# Patient Record
Sex: Female | Born: 1945 | Race: White | Hispanic: No | Marital: Married | Smoking: Former smoker
Health system: Southern US, Community
[De-identification: ages and names within clinical notes are randomized; demographics above are authoritative.]

## PROBLEM LIST (undated history)

## (undated) DIAGNOSIS — M1990 Unspecified osteoarthritis, unspecified site: Secondary | ICD-10-CM

## (undated) DIAGNOSIS — R112 Nausea with vomiting, unspecified: Secondary | ICD-10-CM

## (undated) DIAGNOSIS — D509 Iron deficiency anemia, unspecified: Secondary | ICD-10-CM

## (undated) DIAGNOSIS — M797 Fibromyalgia: Secondary | ICD-10-CM

## (undated) DIAGNOSIS — Z9889 Other specified postprocedural states: Secondary | ICD-10-CM

## (undated) DIAGNOSIS — K3184 Gastroparesis: Secondary | ICD-10-CM

## (undated) DIAGNOSIS — F32A Depression, unspecified: Secondary | ICD-10-CM

## (undated) DIAGNOSIS — I10 Essential (primary) hypertension: Secondary | ICD-10-CM

## (undated) DIAGNOSIS — N3010 Interstitial cystitis (chronic) without hematuria: Secondary | ICD-10-CM

## (undated) DIAGNOSIS — K589 Irritable bowel syndrome without diarrhea: Secondary | ICD-10-CM

## (undated) DIAGNOSIS — F329 Major depressive disorder, single episode, unspecified: Secondary | ICD-10-CM

## (undated) DIAGNOSIS — J449 Chronic obstructive pulmonary disease, unspecified: Secondary | ICD-10-CM

## (undated) DIAGNOSIS — T884XXA Failed or difficult intubation, initial encounter: Secondary | ICD-10-CM

## (undated) HISTORY — DX: Unspecified osteoarthritis, unspecified site: M19.90

## (undated) HISTORY — DX: Fibromyalgia: M79.7

## (undated) HISTORY — DX: Interstitial cystitis (chronic) without hematuria: N30.10

## (undated) HISTORY — DX: Iron deficiency anemia, unspecified: D50.9

## (undated) HISTORY — PX: THYROIDECTOMY, PARTIAL: SHX18

## (undated) HISTORY — DX: Irritable bowel syndrome without diarrhea: K58.9

## (undated) HISTORY — DX: Gastroparesis: K31.84

## (undated) HISTORY — DX: Essential (primary) hypertension: I10

## (undated) HISTORY — PX: ANTERIOR CERVICAL DECOMP/DISCECTOMY FUSION: SHX1161

## (undated) HISTORY — PX: LUMBAR FUSION: SHX111

---
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
## 1972-07-12 HISTORY — PX: APPENDECTOMY: SHX54

## 1995-07-13 HISTORY — PX: VESICO-VAGINAL FISTULA REPAIR: SHX5129

## 1996-07-12 HISTORY — PX: VESICOVAGINAL FISTULA CLOSURE W/ TAH: SUR271

## 1997-11-08 ENCOUNTER — Encounter: Admission: RE | Admit: 1997-11-08 | Discharge: 1998-02-06 | Payer: Self-pay | Admitting: Gastroenterology

## 1997-12-24 ENCOUNTER — Ambulatory Visit (HOSPITAL_BASED_OUTPATIENT_CLINIC_OR_DEPARTMENT_OTHER): Admission: RE | Admit: 1997-12-24 | Discharge: 1997-12-24 | Payer: Self-pay | Admitting: Plastic Surgery

## 1998-02-28 ENCOUNTER — Encounter: Admission: RE | Admit: 1998-02-28 | Discharge: 1998-05-29 | Payer: Self-pay | Admitting: Gastroenterology

## 1998-06-10 ENCOUNTER — Encounter: Admission: RE | Admit: 1998-06-10 | Discharge: 1998-09-08 | Payer: Self-pay | Admitting: Gastroenterology

## 1998-06-24 ENCOUNTER — Other Ambulatory Visit: Admission: RE | Admit: 1998-06-24 | Discharge: 1998-06-24 | Payer: Self-pay | Admitting: Obstetrics and Gynecology

## 1998-11-20 ENCOUNTER — Encounter: Admission: RE | Admit: 1998-11-20 | Discharge: 1999-02-18 | Payer: Self-pay | Admitting: Gastroenterology

## 1999-01-12 ENCOUNTER — Ambulatory Visit (HOSPITAL_COMMUNITY): Admission: RE | Admit: 1999-01-12 | Discharge: 1999-01-12 | Payer: Self-pay | Admitting: Obstetrics and Gynecology

## 1999-01-12 ENCOUNTER — Encounter: Payer: Self-pay | Admitting: Obstetrics and Gynecology

## 1999-01-20 ENCOUNTER — Ambulatory Visit (HOSPITAL_COMMUNITY): Admission: RE | Admit: 1999-01-20 | Discharge: 1999-01-20 | Payer: Self-pay | Admitting: Gastroenterology

## 1999-01-20 ENCOUNTER — Encounter (INDEPENDENT_AMBULATORY_CARE_PROVIDER_SITE_OTHER): Payer: Self-pay | Admitting: Specialist

## 1999-02-26 ENCOUNTER — Encounter: Payer: Self-pay | Admitting: Neurological Surgery

## 1999-02-26 ENCOUNTER — Inpatient Hospital Stay (HOSPITAL_COMMUNITY): Admission: RE | Admit: 1999-02-26 | Discharge: 1999-02-27 | Payer: Self-pay | Admitting: Neurological Surgery

## 1999-04-29 ENCOUNTER — Encounter: Admission: RE | Admit: 1999-04-29 | Discharge: 1999-04-29 | Payer: Self-pay | Admitting: Neurological Surgery

## 1999-04-29 ENCOUNTER — Encounter: Payer: Self-pay | Admitting: Neurological Surgery

## 1999-04-30 ENCOUNTER — Encounter: Admission: RE | Admit: 1999-04-30 | Discharge: 1999-06-19 | Payer: Self-pay | Admitting: Neurological Surgery

## 1999-05-05 ENCOUNTER — Encounter: Payer: Self-pay | Admitting: Neurological Surgery

## 1999-05-05 ENCOUNTER — Ambulatory Visit (HOSPITAL_COMMUNITY): Admission: RE | Admit: 1999-05-05 | Discharge: 1999-05-05 | Payer: Self-pay | Admitting: Neurological Surgery

## 1999-07-16 ENCOUNTER — Other Ambulatory Visit: Admission: RE | Admit: 1999-07-16 | Discharge: 1999-07-16 | Payer: Self-pay | Admitting: Obstetrics and Gynecology

## 1999-11-05 ENCOUNTER — Encounter: Payer: Self-pay | Admitting: Obstetrics and Gynecology

## 1999-11-05 ENCOUNTER — Ambulatory Visit (HOSPITAL_COMMUNITY): Admission: RE | Admit: 1999-11-05 | Discharge: 1999-11-05 | Payer: Self-pay | Admitting: Obstetrics and Gynecology

## 2000-03-10 ENCOUNTER — Ambulatory Visit (HOSPITAL_COMMUNITY): Admission: RE | Admit: 2000-03-10 | Discharge: 2000-03-10 | Payer: Self-pay | Admitting: Pulmonary Disease

## 2000-08-04 ENCOUNTER — Other Ambulatory Visit: Admission: RE | Admit: 2000-08-04 | Discharge: 2000-08-04 | Payer: Self-pay | Admitting: Obstetrics and Gynecology

## 2000-12-07 ENCOUNTER — Ambulatory Visit (HOSPITAL_COMMUNITY): Admission: RE | Admit: 2000-12-07 | Discharge: 2000-12-07 | Payer: Self-pay | Admitting: Pulmonary Disease

## 2000-12-17 ENCOUNTER — Encounter: Admission: RE | Admit: 2000-12-17 | Discharge: 2000-12-17 | Payer: Self-pay | Admitting: Rheumatology

## 2000-12-17 ENCOUNTER — Encounter: Payer: Self-pay | Admitting: Rheumatology

## 2000-12-26 ENCOUNTER — Encounter (HOSPITAL_COMMUNITY): Admission: RE | Admit: 2000-12-26 | Discharge: 2001-01-25 | Payer: Self-pay | Admitting: Sports Medicine

## 2001-07-25 ENCOUNTER — Ambulatory Visit (HOSPITAL_COMMUNITY): Admission: RE | Admit: 2001-07-25 | Discharge: 2001-07-25 | Payer: Self-pay | Admitting: Sports Medicine

## 2001-07-25 ENCOUNTER — Encounter: Payer: Self-pay | Admitting: Sports Medicine

## 2001-08-31 ENCOUNTER — Other Ambulatory Visit: Admission: RE | Admit: 2001-08-31 | Discharge: 2001-08-31 | Payer: Self-pay | Admitting: Obstetrics and Gynecology

## 2001-09-05 ENCOUNTER — Encounter: Admission: RE | Admit: 2001-09-05 | Discharge: 2001-09-05 | Payer: Self-pay | Admitting: Obstetrics and Gynecology

## 2001-09-05 ENCOUNTER — Encounter: Payer: Self-pay | Admitting: Obstetrics and Gynecology

## 2001-09-25 ENCOUNTER — Encounter (INDEPENDENT_AMBULATORY_CARE_PROVIDER_SITE_OTHER): Payer: Self-pay | Admitting: Specialist

## 2001-09-25 ENCOUNTER — Inpatient Hospital Stay (HOSPITAL_COMMUNITY): Admission: RE | Admit: 2001-09-25 | Discharge: 2001-09-28 | Payer: Self-pay | Admitting: Obstetrics and Gynecology

## 2002-02-23 ENCOUNTER — Emergency Department (HOSPITAL_COMMUNITY): Admission: EM | Admit: 2002-02-23 | Discharge: 2002-02-23 | Payer: Self-pay | Admitting: Emergency Medicine

## 2002-02-23 ENCOUNTER — Encounter: Payer: Self-pay | Admitting: Emergency Medicine

## 2002-03-05 ENCOUNTER — Ambulatory Visit (HOSPITAL_COMMUNITY): Admission: RE | Admit: 2002-03-05 | Discharge: 2002-03-05 | Payer: Self-pay | Admitting: *Deleted

## 2002-07-20 ENCOUNTER — Encounter: Payer: Self-pay | Admitting: Family Medicine

## 2002-07-20 ENCOUNTER — Ambulatory Visit (HOSPITAL_COMMUNITY): Admission: RE | Admit: 2002-07-20 | Discharge: 2002-07-20 | Payer: Self-pay | Admitting: Family Medicine

## 2002-08-15 ENCOUNTER — Encounter: Payer: Self-pay | Admitting: Family Medicine

## 2002-08-15 ENCOUNTER — Ambulatory Visit (HOSPITAL_COMMUNITY): Admission: RE | Admit: 2002-08-15 | Discharge: 2002-08-15 | Payer: Self-pay | Admitting: Family Medicine

## 2002-09-17 ENCOUNTER — Other Ambulatory Visit: Admission: RE | Admit: 2002-09-17 | Discharge: 2002-09-17 | Payer: Self-pay | Admitting: Obstetrics and Gynecology

## 2002-10-09 ENCOUNTER — Encounter (INDEPENDENT_AMBULATORY_CARE_PROVIDER_SITE_OTHER): Payer: Self-pay | Admitting: Specialist

## 2002-10-09 ENCOUNTER — Ambulatory Visit (HOSPITAL_COMMUNITY): Admission: RE | Admit: 2002-10-09 | Discharge: 2002-10-09 | Payer: Self-pay | Admitting: Obstetrics and Gynecology

## 2002-10-26 ENCOUNTER — Encounter: Payer: Self-pay | Admitting: Orthopedic Surgery

## 2002-10-26 ENCOUNTER — Ambulatory Visit (HOSPITAL_COMMUNITY): Admission: RE | Admit: 2002-10-26 | Discharge: 2002-10-26 | Payer: Self-pay | Admitting: Orthopedic Surgery

## 2002-12-03 ENCOUNTER — Ambulatory Visit (HOSPITAL_COMMUNITY): Admission: RE | Admit: 2002-12-03 | Discharge: 2002-12-03 | Payer: Self-pay | Admitting: Pulmonary Disease

## 2002-12-11 ENCOUNTER — Ambulatory Visit (HOSPITAL_COMMUNITY): Admission: RE | Admit: 2002-12-11 | Discharge: 2002-12-11 | Payer: Self-pay | Admitting: Pulmonary Disease

## 2003-12-03 ENCOUNTER — Encounter (INDEPENDENT_AMBULATORY_CARE_PROVIDER_SITE_OTHER): Payer: Self-pay | Admitting: Specialist

## 2003-12-03 ENCOUNTER — Ambulatory Visit (HOSPITAL_COMMUNITY): Admission: RE | Admit: 2003-12-03 | Discharge: 2003-12-03 | Payer: Self-pay | Admitting: Gastroenterology

## 2004-11-11 ENCOUNTER — Other Ambulatory Visit: Admission: RE | Admit: 2004-11-11 | Discharge: 2004-11-11 | Payer: Self-pay | Admitting: Obstetrics and Gynecology

## 2005-04-16 ENCOUNTER — Encounter: Admission: RE | Admit: 2005-04-16 | Discharge: 2005-04-16 | Payer: Self-pay | Admitting: Gastroenterology

## 2005-05-22 ENCOUNTER — Emergency Department (HOSPITAL_COMMUNITY): Admission: EM | Admit: 2005-05-22 | Discharge: 2005-05-22 | Payer: Self-pay | Admitting: Emergency Medicine

## 2007-08-14 ENCOUNTER — Ambulatory Visit: Payer: Self-pay | Admitting: Physical Medicine & Rehabilitation

## 2007-08-14 ENCOUNTER — Encounter
Admission: RE | Admit: 2007-08-14 | Discharge: 2007-08-14 | Payer: Self-pay | Admitting: Physical Medicine & Rehabilitation

## 2008-02-16 ENCOUNTER — Ambulatory Visit (HOSPITAL_COMMUNITY): Admission: RE | Admit: 2008-02-16 | Discharge: 2008-02-16 | Payer: Self-pay | Admitting: Pulmonary Disease

## 2008-05-07 ENCOUNTER — Ambulatory Visit (HOSPITAL_BASED_OUTPATIENT_CLINIC_OR_DEPARTMENT_OTHER): Admission: RE | Admit: 2008-05-07 | Discharge: 2008-05-07 | Payer: Self-pay | Admitting: Pulmonary Disease

## 2008-05-11 ENCOUNTER — Ambulatory Visit: Payer: Self-pay | Admitting: Internal Medicine

## 2008-07-02 ENCOUNTER — Ambulatory Visit (HOSPITAL_COMMUNITY): Admission: RE | Admit: 2008-07-02 | Discharge: 2008-07-02 | Payer: Self-pay | Admitting: Pulmonary Disease

## 2008-07-12 HISTORY — PX: SPINAL FUSION: SHX223

## 2008-12-31 ENCOUNTER — Encounter (HOSPITAL_COMMUNITY): Admission: RE | Admit: 2008-12-31 | Discharge: 2008-12-31 | Payer: Self-pay | Admitting: Neurosurgery

## 2009-01-22 ENCOUNTER — Ambulatory Visit (HOSPITAL_COMMUNITY): Admission: RE | Admit: 2009-01-22 | Discharge: 2009-01-22 | Payer: Self-pay | Admitting: Pulmonary Disease

## 2009-03-06 ENCOUNTER — Ambulatory Visit (HOSPITAL_COMMUNITY): Admission: AD | Admit: 2009-03-06 | Discharge: 2009-03-07 | Payer: Self-pay | Admitting: Gastroenterology

## 2009-08-20 ENCOUNTER — Ambulatory Visit (HOSPITAL_COMMUNITY): Admission: RE | Admit: 2009-08-20 | Discharge: 2009-08-20 | Payer: Self-pay | Admitting: Pulmonary Disease

## 2009-10-20 ENCOUNTER — Ambulatory Visit (HOSPITAL_COMMUNITY): Admission: RE | Admit: 2009-10-20 | Discharge: 2009-10-20 | Payer: Self-pay | Admitting: Pulmonary Disease

## 2009-11-10 ENCOUNTER — Ambulatory Visit (HOSPITAL_COMMUNITY): Admission: RE | Admit: 2009-11-10 | Discharge: 2009-11-10 | Payer: Self-pay | Admitting: Neurosurgery

## 2010-04-15 ENCOUNTER — Ambulatory Visit (HOSPITAL_COMMUNITY): Admission: RE | Admit: 2010-04-15 | Discharge: 2010-04-15 | Payer: Self-pay | Admitting: Neurosurgery

## 2010-04-30 ENCOUNTER — Ambulatory Visit (HOSPITAL_COMMUNITY): Admission: RE | Admit: 2010-04-30 | Discharge: 2010-04-30 | Payer: Self-pay | Admitting: Pulmonary Disease

## 2010-06-29 ENCOUNTER — Ambulatory Visit (HOSPITAL_COMMUNITY): Payer: Self-pay | Admitting: Oncology

## 2010-07-14 ENCOUNTER — Encounter (HOSPITAL_COMMUNITY)
Admission: RE | Admit: 2010-07-14 | Discharge: 2010-08-11 | Payer: Self-pay | Source: Home / Self Care | Attending: Oncology | Admitting: Oncology

## 2010-07-20 ENCOUNTER — Ambulatory Visit (HOSPITAL_COMMUNITY)
Admission: RE | Admit: 2010-07-20 | Discharge: 2010-07-20 | Payer: Self-pay | Source: Home / Self Care | Attending: Oncology | Admitting: Oncology

## 2010-07-27 LAB — RETICULOCYTES
RBC.: 3.94 MIL/uL (ref 3.87–5.11)
Retic Count, Absolute: 35.5 10*3/uL (ref 19.0–186.0)
Retic Ct Pct: 0.9 % (ref 0.4–3.1)

## 2010-07-27 LAB — IMMUNOFIXATION ELECTROPHORESIS
IgA: 250 mg/dL (ref 68–378)
IgG (Immunoglobin G), Serum: 432 mg/dL — ABNORMAL LOW (ref 694–1618)
IgM, Serum: 38 mg/dL — ABNORMAL LOW (ref 60–263)
Total Protein ELP: 6.2 g/dL (ref 6.0–8.3)

## 2010-07-27 LAB — CBC
HCT: 33.6 % — ABNORMAL LOW (ref 36.0–46.0)
Hemoglobin: 11.2 g/dL — ABNORMAL LOW (ref 12.0–15.0)
MCH: 28.4 pg (ref 26.0–34.0)
MCHC: 33.3 g/dL (ref 30.0–36.0)
MCV: 85.3 fL (ref 78.0–100.0)
Platelets: 334 10*3/uL (ref 150–400)
RBC: 3.94 MIL/uL (ref 3.87–5.11)
RDW: 19.3 % — ABNORMAL HIGH (ref 11.5–15.5)
WBC: 8.1 10*3/uL (ref 4.0–10.5)

## 2010-07-27 LAB — PROTEIN ELECTROPH W RFLX QUANT IMMUNOGLOBULINS
Albumin ELP: 59.3 % (ref 55.8–66.1)
Alpha-1-Globulin: 6.3 % — ABNORMAL HIGH (ref 2.9–4.9)
Alpha-2-Globulin: 14.5 % — ABNORMAL HIGH (ref 7.1–11.8)
Beta 2: 5.3 % (ref 3.2–6.5)
Beta Globulin: 7 % (ref 4.7–7.2)
Gamma Globulin: 7.6 % — ABNORMAL LOW (ref 11.1–18.8)
M-Spike, %: NOT DETECTED g/dL
Total Protein ELP: 6.1 g/dL (ref 6.0–8.3)

## 2010-07-27 LAB — IRON AND TIBC
Iron: 23 ug/dL — ABNORMAL LOW (ref 42–135)
Saturation Ratios: 8 % — ABNORMAL LOW (ref 20–55)
TIBC: 304 ug/dL (ref 250–470)
UIBC: 281 ug/dL

## 2010-07-27 LAB — IGG, IGA, IGM
IgA: 255 mg/dL (ref 68–378)
IgG (Immunoglobin G), Serum: 468 mg/dL — ABNORMAL LOW (ref 694–1618)
IgM, Serum: 40 mg/dL — ABNORMAL LOW (ref 60–263)

## 2010-07-27 LAB — IMMUNOFIXATION ADD-ON

## 2010-07-27 LAB — FERRITIN: Ferritin: 67 ng/mL (ref 10–291)

## 2010-07-27 LAB — SEDIMENTATION RATE: Sed Rate: 16 mm/hr (ref 0–22)

## 2010-08-01 ENCOUNTER — Encounter: Payer: Self-pay | Admitting: Pulmonary Disease

## 2010-08-01 ENCOUNTER — Encounter: Payer: Self-pay | Admitting: Gastroenterology

## 2010-08-02 ENCOUNTER — Encounter: Payer: Self-pay | Admitting: Pulmonary Disease

## 2010-08-07 ENCOUNTER — Encounter
Admission: RE | Admit: 2010-08-07 | Discharge: 2010-08-07 | Payer: Self-pay | Source: Home / Self Care | Attending: Otolaryngology | Admitting: Otolaryngology

## 2010-08-21 ENCOUNTER — Encounter (HOSPITAL_COMMUNITY): Payer: BC Managed Care – PPO | Attending: Oncology

## 2010-08-21 ENCOUNTER — Other Ambulatory Visit (HOSPITAL_COMMUNITY): Payer: BC Managed Care – PPO

## 2010-08-21 ENCOUNTER — Other Ambulatory Visit (HOSPITAL_COMMUNITY): Payer: Self-pay | Admitting: Oncology

## 2010-08-21 DIAGNOSIS — D649 Anemia, unspecified: Secondary | ICD-10-CM

## 2010-08-21 DIAGNOSIS — D509 Iron deficiency anemia, unspecified: Secondary | ICD-10-CM | POA: Insufficient documentation

## 2010-08-21 LAB — CBC
MCH: 30.3 pg (ref 26.0–34.0)
MCHC: 34 g/dL (ref 30.0–36.0)
MCV: 89 fL (ref 78.0–100.0)
Platelets: 244 10*3/uL (ref 150–400)
RBC: 4.36 MIL/uL (ref 3.87–5.11)
RDW: 17.5 % — ABNORMAL HIGH (ref 11.5–15.5)

## 2010-08-22 LAB — FERRITIN: Ferritin: 734 ng/mL — ABNORMAL HIGH (ref 10–291)

## 2010-09-15 ENCOUNTER — Other Ambulatory Visit (HOSPITAL_COMMUNITY): Payer: Self-pay | Admitting: Neurosurgery

## 2010-09-15 DIAGNOSIS — M542 Cervicalgia: Secondary | ICD-10-CM

## 2010-09-18 ENCOUNTER — Other Ambulatory Visit (HOSPITAL_COMMUNITY): Payer: Self-pay | Admitting: Neurosurgery

## 2010-09-18 ENCOUNTER — Ambulatory Visit (HOSPITAL_COMMUNITY)
Admission: RE | Admit: 2010-09-18 | Discharge: 2010-09-18 | Disposition: A | Discharge status: Home / Self Care | Payer: BC Managed Care – PPO | Source: Ambulatory Visit | Attending: Neurosurgery | Admitting: Neurosurgery

## 2010-09-18 DIAGNOSIS — M899 Disorder of bone, unspecified: Secondary | ICD-10-CM | POA: Insufficient documentation

## 2010-09-18 DIAGNOSIS — M542 Cervicalgia: Secondary | ICD-10-CM

## 2010-09-18 DIAGNOSIS — M546 Pain in thoracic spine: Secondary | ICD-10-CM | POA: Insufficient documentation

## 2010-09-18 DIAGNOSIS — M4120 Other idiopathic scoliosis, site unspecified: Secondary | ICD-10-CM | POA: Insufficient documentation

## 2010-09-18 DIAGNOSIS — IMO0002 Reserved for concepts with insufficient information to code with codable children: Secondary | ICD-10-CM | POA: Insufficient documentation

## 2010-10-13 ENCOUNTER — Other Ambulatory Visit (HOSPITAL_COMMUNITY): Payer: Self-pay | Admitting: Neurosurgery

## 2010-10-13 DIAGNOSIS — M47812 Spondylosis without myelopathy or radiculopathy, cervical region: Secondary | ICD-10-CM

## 2010-10-15 ENCOUNTER — Encounter (HOSPITAL_COMMUNITY)
Admission: RE | Admit: 2010-10-15 | Discharge: 2010-10-15 | Disposition: A | Discharge status: Home / Self Care | Payer: BC Managed Care – PPO | Source: Ambulatory Visit | Attending: Neurosurgery | Admitting: Neurosurgery

## 2010-10-15 ENCOUNTER — Encounter (HOSPITAL_COMMUNITY): Payer: Self-pay

## 2010-10-15 ENCOUNTER — Encounter (HOSPITAL_COMMUNITY): Payer: BC Managed Care – PPO

## 2010-10-15 DIAGNOSIS — Z981 Arthrodesis status: Secondary | ICD-10-CM | POA: Insufficient documentation

## 2010-10-15 DIAGNOSIS — M47812 Spondylosis without myelopathy or radiculopathy, cervical region: Secondary | ICD-10-CM

## 2010-10-15 DIAGNOSIS — Z85828 Personal history of other malignant neoplasm of skin: Secondary | ICD-10-CM | POA: Insufficient documentation

## 2010-10-15 DIAGNOSIS — M545 Low back pain, unspecified: Secondary | ICD-10-CM | POA: Insufficient documentation

## 2010-10-15 DIAGNOSIS — M542 Cervicalgia: Secondary | ICD-10-CM | POA: Insufficient documentation

## 2010-10-15 MED ORDER — TECHNETIUM TC 99M MEDRONATE IV KIT
25.0000 | PACK | Freq: Once | INTRAVENOUS | Status: AC | PRN
  Administered 2010-10-15: 25 via INTRAVENOUS

## 2010-10-16 ENCOUNTER — Ambulatory Visit (HOSPITAL_COMMUNITY): Payer: BC Managed Care – PPO

## 2010-10-16 ENCOUNTER — Encounter (HOSPITAL_COMMUNITY): Payer: BC Managed Care – PPO

## 2010-10-17 LAB — CBC
HCT: 30.3 % — ABNORMAL LOW (ref 36.0–46.0)
Hemoglobin: 9.3 g/dL — ABNORMAL LOW (ref 12.0–15.0)
Hemoglobin: 9.8 g/dL — ABNORMAL LOW (ref 12.0–15.0)
MCHC: 32.3 g/dL (ref 30.0–36.0)
MCHC: 33.2 g/dL (ref 30.0–36.0)
MCV: 80.4 fL (ref 78.0–100.0)
RBC: 3.49 MIL/uL — ABNORMAL LOW (ref 3.87–5.11)
RBC: 3.79 MIL/uL — ABNORMAL LOW (ref 3.87–5.11)
RDW: 16.5 % — ABNORMAL HIGH (ref 11.5–15.5)
RDW: 16.6 % — ABNORMAL HIGH (ref 11.5–15.5)

## 2010-10-17 LAB — BASIC METABOLIC PANEL
BUN: 9 mg/dL (ref 6–23)
CO2: 26 mEq/L (ref 19–32)
CO2: 28 mEq/L (ref 19–32)
Calcium: 8.5 mg/dL (ref 8.4–10.5)
Chloride: 100 mEq/L (ref 96–112)
Chloride: 99 mEq/L (ref 96–112)
GFR calc Af Amer: >60 mL/min (ref >60)
GFR calc non Af Amer: >60 mL/min (ref >60)
Glucose, Bld: 127 mg/dL — ABNORMAL HIGH (ref 70–99)
Glucose, Bld: 135 mg/dL — ABNORMAL HIGH (ref 70–99)
Potassium: 2.4 mEq/L — CL (ref 3.5–5.1)
Potassium: 3.1 mEq/L — ABNORMAL LOW (ref 3.5–5.1)
Sodium: 133 mEq/L — ABNORMAL LOW (ref 135–145)
Sodium: 137 mEq/L (ref 135–145)

## 2010-10-17 LAB — BASIC METABOLIC PANEL WITH GFR
BUN: 4 mg/dL — ABNORMAL LOW (ref 6–23)
CO2: 24 meq/L (ref 19–32)
Calcium: 8.3 mg/dL — ABNORMAL LOW (ref 8.4–10.5)
Chloride: 106 meq/L (ref 96–112)
Creatinine, Ser: 0.8 mg/dL (ref 0.4–1.2)
GFR calc Af Amer: >60 mL/min (ref >60)
GFR calc non Af Amer: >60 mL/min (ref >60)
Glucose, Bld: 110 mg/dL — ABNORMAL HIGH (ref 70–99)
Potassium: 4.6 meq/L (ref 3.5–5.1)
Sodium: 134 meq/L — ABNORMAL LOW (ref 135–145)

## 2010-10-23 ENCOUNTER — Other Ambulatory Visit (HOSPITAL_COMMUNITY): Payer: BC Managed Care – PPO

## 2010-10-26 ENCOUNTER — Ambulatory Visit (HOSPITAL_COMMUNITY)
Admission: RE | Admit: 2010-10-26 | Discharge: 2010-10-26 | Disposition: A | Discharge status: Home / Self Care | Payer: BC Managed Care – PPO | Source: Ambulatory Visit | Attending: Pulmonary Disease | Admitting: Pulmonary Disease

## 2010-10-26 ENCOUNTER — Other Ambulatory Visit (HOSPITAL_COMMUNITY): Payer: Self-pay | Admitting: Pulmonary Disease

## 2010-10-26 DIAGNOSIS — Z87891 Personal history of nicotine dependence: Secondary | ICD-10-CM | POA: Insufficient documentation

## 2010-10-26 DIAGNOSIS — R05 Cough: Secondary | ICD-10-CM

## 2010-10-26 DIAGNOSIS — R059 Cough, unspecified: Secondary | ICD-10-CM

## 2010-10-28 ENCOUNTER — Other Ambulatory Visit (HOSPITAL_COMMUNITY): Payer: Self-pay | Admitting: Pulmonary Disease

## 2010-10-28 DIAGNOSIS — R609 Edema, unspecified: Secondary | ICD-10-CM

## 2010-10-29 ENCOUNTER — Ambulatory Visit (HOSPITAL_COMMUNITY): Payer: BC Managed Care – PPO

## 2010-10-30 ENCOUNTER — Ambulatory Visit (HOSPITAL_COMMUNITY): Payer: Self-pay | Admitting: Oncology

## 2010-10-30 ENCOUNTER — Ambulatory Visit (HOSPITAL_COMMUNITY)
Admission: RE | Admit: 2010-10-30 | Discharge: 2010-10-30 | Disposition: A | Discharge status: Home / Self Care | Payer: BC Managed Care – PPO | Source: Ambulatory Visit | Attending: Pulmonary Disease | Admitting: Pulmonary Disease

## 2010-10-30 DIAGNOSIS — R930 Abnormal findings on diagnostic imaging of skull and head, not elsewhere classified: Secondary | ICD-10-CM | POA: Insufficient documentation

## 2010-10-30 DIAGNOSIS — R609 Edema, unspecified: Secondary | ICD-10-CM

## 2010-10-30 MED ORDER — GADOBENATE DIMEGLUMINE 529 MG/ML IV SOLN: 15.0000 mL | Freq: Once | INTRAVENOUS | Status: AC | PRN

## 2010-11-03 ENCOUNTER — Ambulatory Visit (HOSPITAL_COMMUNITY): Payer: BC Managed Care – PPO

## 2010-11-04 ENCOUNTER — Encounter (HOSPITAL_COMMUNITY): Payer: BC Managed Care – PPO | Attending: Oncology | Admitting: Oncology

## 2010-11-04 DIAGNOSIS — D509 Iron deficiency anemia, unspecified: Secondary | ICD-10-CM

## 2010-11-24 NOTE — Op Note (Signed)
Kristina Horton, ENSEY NO.:  [id number]   MEDICAL RECORD NO.:  [id number]          PATIENT TYPE:  OIB   LOCATION:  1422                         FACILITY:  [facility]   PHYSICIAN:  Petra Kuba, M.D.    DATE OF BIRTH:  07-23-1945   DATE OF PROCEDURE:  03/07/2009  DATE OF DISCHARGE:                               OPERATIVE REPORT   PROCEDURE:  Esophagogastroduodenoscopy.   INDICATIONS:  Anemia and reflux.   Consent was signed after risks, benefits, methods, options thoroughly  discussed multiple times in the past.   MEDICINES USED:  Per general anesthesia.   PROCEDURE:  The video endoscope was inserted by direct vision.  The  esophagus was normal.  She did have a small hiatal hernia.  Scope passed  into the stomach, advanced through a normal antrum, normal pylorus into  a normal duodenal bulb and around the C-loop to a normal second portion  of the duodenum.  No blood was seen distally.  Scope was withdrawn back  to the bulb and a good look there ruled out abnormalities in that  location.  The scope was withdrawn back to the stomach and retroflexed.  Cardia, fundus, angularis, lesser and greater curve were evaluated on  retroflexed and then straight visualization without additional findings.  Air was suctioned, scope slowly withdrawn again, a good look at the  esophagus was normal, scope was removed.  The patient tolerated the  procedure well.  There was no obvious immediate complication.   ENDOSCOPIC DIAGNOSES:  1. Small hiatal hernia.  2. Otherwise normal esophagogastroduodenoscopy.   PLAN:  Happy to see back p.r.n., return care to her other doctors and  Dr. Juanetta Gosling, and please see colonoscopy for other recommendations,  workup and plans.           ______________________________  Petra Kuba, M.D.     MEM/MEDQ  D:  03/07/2009  T:  03/07/2009  Job:  [id number]   cc:   Ramon Dredge L. Juanetta Gosling, M.D.  Fax: [phone]

## 2010-11-24 NOTE — Group Therapy Note (Signed)
REFERRED BY:  Dr. Juanetta Gosling and Dr. Corliss Skains.   PURPOSE OF EVALUATION:  Evaluate and treat chronic  osteoarthritic/fibromyalgia pain.   HISTORY OF PRESENT ILLNESS:  Kristina Horton is a 65 year old right-handed  Caucasian female referred to this office by Dr. Juanetta Gosling and Dr.  Corliss Skains for evaluation and treatment of chronic arthritic and  fibromyalgia pain.   The patient reports that she started seeing Dr. Corliss Skains April of 2000.  I have some notes that review the office visit with Dr. Corliss Skains and  also the pain medicines prescribed by Dr. Juanetta Gosling.  We did review these  and then there were problems developed in terms of prescription refills  if we were to continue seeing her as a patient.  Those will be described  as below.   The patient reports that she has seen Dr. Corliss Skains since April 2000.  She was diagnosed with fibromyalgia with active disease and generalized  pain and positive tender points.  She also has a history of severe  osteoarthritis involving multiple joints.  She did see a Dr. Darene Lamer, an  orthopedist at [facility].  He told her that she had aggressive  osteoarthritis leading to the fibromyalgia, and the only treatment was  replacement surgery.  She still plans to have knee replacement some time  in the future.   In February 2008 the patient saw Dr. Corliss Skains.  At that time she could  not tolerate the Fentanyl patch prescribed by Dr. Juanetta Gosling and was taking  oxycodone 4 times a day.  She has a positive ANA along with a positive  RNP, positive RO, positive LA and positive smooth muscle antibodies.   In November 2008 the patient was on multiple pain medicines including  hydrocodone 5/500 two tablets daily, Mobic 7.5 mg b.i.d., Opana extended  release 20 mg b.i.d., oxycodone 5-15 mg p.r.n. and Topamax 50 mg t.i.d.  These were prescribed by Dr. Juanetta Gosling.   In December 2008 the patient saw Dr. Corliss Skains and was referred to this  pain clinic.   The patient reports that  she presently is taking the Mobic, Topamax,  Opana extended release.  She reports that she tried Opana extended  release at 40 mg, but that made her too sleepy.  She reports that she  was told Topamax 50 mg t.i.d. is the maximum dose that she should be on.  She reports that on these three main pain medicines, she can function 3  to 4 hours per day.  She reports that she takes the hydrocodone and the  oxycodone very minimally, i.e. one tablet every 2 months for the  hydrocodone and 1 tablet every 6 months for the oxycodone.  She reports  that the oxycodone tends to help more, but that it is easier to function  when she takes the hydrocodone.  She reports having tried Lyrica in the  past without help and with weight gain.  She also reports same side  effects from Neurontin.  She tried amitriptyline in the past with no  relief.  She tried the Fentanyl patch as noted above, which made her  feel goofy.  She has not been on morphine or methadone as best she can  remember.   The patient reports that she is fairly inactive secondary to the high  risk of falls when she is out walking.  She also reports that she can  only drive minimal distances secondary to the pain medicines that she  takes.  She reports that the pain wears her out.  She tends to sleep  approximately 2 a.m. until 10 a.m. daily, but has fatigue during the  day.   PAST MEDICAL HISTORY:  1. Prior hysterectomy in 2003.  2. Anterior cervical diskectomy and fusion C4-C7 August 2000.  3. Tubal ligation in 1978.  4. Exploratory laparotomy and appendectomy in 1975.  5. Decolonization of the cervix in 1992.  6. Partial thyroidectomy.  7. History of skull and left ankle fractures after fall.  8. Repair of rectovaginal fistula in 2004.  9. Osteoarthritis.  10.Fibromyalgia.  11.Depression.  12.Hypertension.  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 Teressa Senter with subsequent      fall and reinjury of the right wrist.   ALLERGIES:   DEMEROL AND GENERIC DIFLUCAN.   FAMILY HISTORY:  Positive for thyroid cancer, heart disease,  hypertension, minimal arthritis and stomach cancer in her brother   SOCIAL HISTORY:  The patient is married and lives with her husband.  She  has two grown children.  She quit tobacco in 1980 and reports rare  ethanol use.  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   MEDICATIONS:  1. Aciphex 20 mg daily.  2. Acyclovir 400 mg 1 tablet b.i.d.  3. Ambien CR 6.5 mg at night.  4. Thyroid 90 mg daily.  5. Aspirin 325 mg daily.  6. Wellbutrin 300 mg daily.  7. Zyrtec 10 mg daily p.r.n.  8. Cymbalta 60 mg daily.  9. Diazepam 5 mg 1/2 tablet at night.  10.Diovan/hydrochlorothiazide 160/12.5 one tablet daily.  11.Elmiron 100 mg q.i.d.  12.Plaquenil 200 mg daily.  13.Mobic 7.5 mg b.i.d.  14.Generic MiraLax p.r.n.  15.Nasacort one squirt each nostril daily.  16.Opana extended release 20 mg b.i.d.  17.Potassium chloride 10 mEq 2 tablets 4 times a day.  18.Topamax 50 mg t.i.d.  19.Triamterene/hydrochlorothiazide 75/50 one tablet daily.  20.Vagifem 25 mcg inserted 2 times per week.  21.Darvocet 100/500 daily p.r.n. for headaches.  22.Hydrocodone 5/500 less than 2 per day p.r.n. for breakthrough pain.  23.Oxycodone 5-15 mg daily p.r.n. (1 every 6 months).  24.Voltaren gel p.r.n. for joint pain.  25.Ondansetron 8 mg p.r.n.  26.Levsin 0.125 mg daily p.r.n.  27.Allegra 60 mg 1 tablet b.i.d. p.r.n.  28.Librax 1 q. 6-8 hours p.r.n.  29.Alprazolam 0.25 mg b.i.d. p.r.n.   REVIEW OF SYSTEMS:  Noncontributory.   PHYSICAL EXAMINATION:  Well appearing middle-aged adult female in mild  acute discomfort.  Blood pressure 129/67 with a pulse of 112,  respiratory rate 20 and O2 saturation 96% on room air.  She ambulates  with difficulty, but does not use any assistive device other than a  single point cane.  She has decreased range of motion throughout her  upper and lower  extremities.  Further exam was not completed secondary  to notes below.   IMPRESSION:  Chronic osteoarthritic/fibromyalgia pain.  We had a  discussion regarding her treatment to date and the fact that she has  been on numerous pain medicines, some schedule II and some schedule III.  At this point, the only pain medicines that she has not been on that  would be worth trying, would be morphine and methadone, and those have  potential side effects with their use.  In any event, even if she were  to start those in place of the Opana, she would need to come in for  monthly refills to be picked up in the office.  She reports that she  lives 30  minutes from this office and only 3 minutes from Dr. Juanetta Gosling'  office.  She reports that she cannot come in to pick up the  prescriptions on a monthly basis, and that it is very inconvenient for  her.  At this point, I would recommend continuation of the Opana as  ordered, i.e. 20 mg b.i.d. along with her Mobic and Topamax on a  scheduled basis.  She is taking such minimal amounts of the hydrocodone  and oxycodone, it does not make sense to alter that at this point.  I  doubt that she would tolerate the morphine without significant side  effects including possible poor analgesic effect.  She seems to be  rather sensitive to medication and she reports that also.   At this point, the patient is agreeable to consider this as a consult  only.  She plans to follow up with Dr. Juanetta Gosling for refill of her pain  medicines.  We will plan on seeing her in followup on an as needed  basis, but at this point there is no recommended change in her medicines  and certainly the geography makes it much easier for her to get those  filled near her home with Dr. Juanetta Gosling.  We will plan on seeing the  patient on an as needed basis.           ______________________________  Kristina Horton, M.D.     DC/MedQ  D:  08/14/2007 14:46:40  T:  08/14/2007 22:04:06  Job #:   [id number]   cc:   Ramon Dredge L. Juanetta Gosling, M.D.  Fax: [phone]   Kathryne Hitch, MD  Fax: [phone]

## 2010-11-24 NOTE — H&P (Signed)
NAME:  Kristina Horton, Kristina Horton NO.:  [id number]   MEDICAL RECORD NO.:  [id number]          PATIENT TYPE:  OIB   LOCATION:  1422                         FACILITY:  [facility]   PHYSICIAN:  Petra Kuba, M.D.    DATE OF BIRTH:  June 05, 1946   DATE OF ADMISSION:  03/06/2009  DATE OF DISCHARGE:                              HISTORY & PHYSICAL   HISTORY:  The patient is admitted from the endoscopy unit for a low  potassium.  She was due for general anesthesia for her colonoscopy and  endoscopy.  Unfortunately her potassium is too low and Anesthesia did  not want to put her to sleep and she has been re-scheduled for tomorrow  while she gets IV potassium and oral potassium.   PAST MEDICAL HISTORY:  1. Depression.  2. Anxiety.  3. Hypertension.  4. Sleep apnea.   She has had multiple surgeries, including:  1. Back fusions.  2. Hand and joint replacements.  3. Hysterectomy.  4. Partial thyroidectomy.  5. Lumpectomy.  6. Tubal ligation.  7. Conization.   FAMILY HISTORY:  Pertinent for a brother with stomach cancer and a  cousin with colon cancer.   SOCIAL HISTORY:  Does not smoke or drink.   ALLERGIES:  1. DEMEROL makes her sick.  2. She is allergic to LATEX.   REVIEW OF SYSTEMS:  Pertinent for her primary symptoms are due to her  pain complaints.  She is about to have another back surgery.  From a GI  standpoint does have some reflux that of late has been moderately well  controlled.   MEDICATIONS:  For medicines, please see her elongated list.  She had  typed it out nicely in her chart.  Unfortunately, I do not have it  handing during dictation.   REVIEW OF SYSTEMS:  Negative except as above.   PHYSICAL EXAMINATION:  GENERAL:  No acute distress, lying comfortably in  the bed.  HEENT:  Sclerae are nonicteric.  NECK:  Supple without adenopathy.  LUNGS:  Clear.  HEART:  Regular rate and rhythm.  ABDOMEN:  Soft and nontender.   LABORATORY DATA:  Pertinent for a  normal BUN and creatinine.  Hemoglobin  of 10.  Potassium 2.4.  EKG without acute changes.   ASSESSMENT:  1. Multiple medical complaints and problems.  2. Due for colonoscopy for screening, endoscopy for upper tract      symptoms with a family history of brother with stomach cancer and      cousin with colon cancer.  3. Low potassium secondary to her diuretics and stopping her      potassium, which she did not use yesterday for fears of it hurting      her stomach as well as the prep.   PLAN:  We will replete potassium both IV and p.o.  Check it today and  first thing in the morning and then proceed with her procedures as per  Anesthesia at noon tomorrow with further workup and plans pending those  findings, and hopefully she can go home tomorrow.  ______________________________  Petra Kuba, M.D.     MEM/MEDQ  D:  03/06/2009  T:  03/06/2009  Job:  [id number]

## 2010-11-24 NOTE — Procedures (Signed)
NAME:  Kristina Horton, Kristina Horton NO.:  [id number]   MEDICAL RECORD NO.:  [id number]          PATIENT TYPE:  OUT   LOCATION:  RESP                          FACILITY:  [facility]   PHYSICIAN:  [name], M.D.DATE OF BIRTH:  1946/06/02   DATE OF PROCEDURE:  DATE OF DISCHARGE:                            PULMONARY FUNCTION TEST   1. Spirometry shows no ventilatory defect and no airflow obstruction.  2. Lung volumes are normal.  3. DLCO is normal.  4. Arterial blood gases normal.      [name], M.D.  Electronically Signed     ELH/MEDQ  D:  07/02/2008  T:  07/03/2008  Job:  [id number]

## 2010-11-24 NOTE — Op Note (Signed)
NAMEMARNA, WENIGER NO.:  [id number]   MEDICAL RECORD NO.:  [id number]          PATIENT TYPE:  OIB   LOCATION:  1422                         FACILITY:  [facility]   PHYSICIAN:  Petra Kuba, M.D.    DATE OF BIRTH:  10-04-1945   DATE OF PROCEDURE:  03/07/2009  DATE OF DISCHARGE:                               OPERATIVE REPORT   PROCEDURE:  Colonoscopy.   INDICATIONS FOR PROCEDURE:  Family history of colon cancer. Anemia.  Consent was signed after risks, benefits, methods, options were  thoroughly discussed multiple times in the past.   ANESTHESIA:  Medicines used as general anesthesia per the anesthesia  team.   DESCRIPTION OF PROCEDURE:  Rectal inspection is pertinent for small  external hemorrhoids.  Digital exam was negative.  The video pediatric  colonoscope was inserted, with some difficulty due to fairly poor prep  and some tortuosity and looping. With abdominal pressure we were able to  be advanced to the cecum. On insertion some rare early left-sided  diverticula were seen, but no other abnormalities.  The cecum was  identified by the ileocecal valve.  Unfortunately the entire cecal pole  was filled with stool. Despite washing and suctioning, we could not  clear the cecum. We clogged the scope a few times, requiring re-washing  more. We did try to roll her on her back to change the puddle of stool  but could not. The landmarks were in the proper position, however,  although the appendiceal orifice was not seen.  We did confirm the  valve, so we elected to withdraw.  There was some formed stool in  probably both flexures which limited visualization there, but on slow  withdrawal through the colon, no signs of bleeding, polyps, tumors, or  masses were seen, and the rare diverticula in the left side were  confirmed.  Once back in the rectum, anorectal pull-through and  retroflexion confirmed some small hemorrhoids.  Scope was straightened  and readvanced out  the left side of the colon.  Air was suctioned, scope  removed.  The patient tolerated the procedure well.  There was no  obvious immediate complication.   ENDOSCOPIC DIAGNOSES:  1. Internal-external hemorrhoids.  2. Rare early left-sided diverticula.  3. Poor prep.  4. Increased stool, particularly in the cecum and the flexures, with      limited visualization in those areas.  5. Otherwise within normal limits without any heme being seen.   PLAN:  Consider a virtual colonoscopy in 3 years or when we proceed with  increased prep.  Continue workup with an endoscopy.           ______________________________  Petra Kuba, M.D.     MEM/MEDQ  D:  03/07/2009  T:  03/07/2009  Job:  [id number]   cc:   Ramon Dredge L. Juanetta Gosling, M.D.  Fax: [phone]

## 2010-11-27 NOTE — Discharge Summary (Signed)
[facility]  Patient:    Kristina Horton, Kristina Horton Visit Number: [id number] MRN: [id number]          Service Type: GYN Location: [id number] Attending Physician:  Osborn Coho Dictated by:   Janeece Riggers Dareen Piano, M.D. Admit Date:  09/25/2001 Discharge Date: 09/28/2001                             Discharge Summary  PRINCIPAL DISCHARGE DIAGNOSES:                    1. Pelvic prolapse.                               2. Symptomatic rectocele.                               3. Pelvic pain.                               4. Uterine fibroids.  PRINCIPAL PROCEDURES:         1. Laparoscopically-assisted vaginal                                  hysterectomy with bilateral                                  salpingo-oophorectomy.                               2. Posterior colporrhaphy.                               3. Lysis of adhesions.  HOSPITAL COURSE:              Kristina Horton is a 65 year old white female G2, P2-0-0-2 who presented at the [facility] on September 25, 2001 for definitive surgery for her symptomatic uterine prolapse and pelvic pain.  The patient has had this pain for greater than three years; however, it has been worsening significantly in the last year.  The patient has had an extensive GI workup, which was all negative.  A CT scan recently obtained was also negative.  The patient underwent this procedure on September 25, 2001.  A complete description of this can be found in the dictated operative notes.  The patients subsequent pathology was all benign.  The patients preoperative hemoglobin was 13.7, postop 10.8.  The patients postoperative course was uncomplicated.  She remained afebrile throughout the hospitalization.  The patient was ambulating without difficulty at the time of discharge.  She was having flatus and eating a regular diet.  DISCHARGE MEDICATIONS:        The patient was discharged to home on Dilaudid 2 mg 1/2 p.o. q.4h. p.r.n.  and Colace 100 mg p.o. b.i.d.  FOLLOWUP:                     She will follow up in the office in four weeks. Dictated by:   Janeece Riggers Dareen Piano, M.D. Attending Physician:  Osborn Coho DD:  09/28/01 TD:  09/29/01 Job: [id number]

## 2010-11-27 NOTE — Cardiovascular Report (Signed)
   NAME:  Kristina Horton, Kristina Horton NO.:  [id number]   MEDICAL RECORD NO.:  [id number]                   PATIENT TYPE:  OIB   LOCATION:  2861                                 FACILITY:  [facility]   PHYSICIAN:  Darlin Priestly, M.D.             DATE OF BIRTH:  August 02, 1945   DATE OF PROCEDURE:  03/05/2002  DATE OF DISCHARGE:  03/05/2002                              CARDIAC CATHETERIZATION   PROCEDURE:  Heads-up tilt table testing with isoproterenol infusion.   ATTENDING FOR PROCEDURE:  Darlin Priestly, M.D.   COMPLICATIONS:  None.   INDICATIONS FOR PROCEDURE:  The patient is a 65 year old female patient of  Dr. Juanetta Gosling with a history of fibromyalgia, depression, hypertension,  anxiety disorder, with a history of dizziness which has been chronic.  She  did have a history of a remote fall with a skull fracture in 1993.  She is  now referred for tilt-table testing to rule out near cardiogenic syncope as  a possible etiology.   DESCRIPTION OF PROCEDURE:  After giving informed written consent, the  patient was brought to the cardiac catheterization lab in a fasting state.  The patient was then placed in the supine position and hemodynamic  monitoring was established.  The patient's baseline blood pressure was  129/70 with a heart rate of 80.  The patient was then tilted to 70-degree  heads-up position which she maintained for approximately 30 minutes.  The  patient had no significant change in her blood pressure or heart rate.  She  was then placed in a supine position and isoproterenol infusion was  undergone at 0.5 mcg.  This was continued for approximately 5 minutes and  this was increased to 1 mcg.  After isoproterenol infusion, she had no  change in her blood pressure with a slight increase in her heart rate.  She  was then tilted again to a heads-up 70-degree position with her heart rate  anywhere from 114 to 133.  She remained hemodynamically stable with no  change in her blood pressure.  This was continued for approximately 10  minutes and then she was placed back in a supine position.  Hemodynamic  measurements revealed no significant change in her heart rate or blood  pressure.   CONCLUSION:  Negative heads-up tilt-table testing with isoproterenol  infusion.                                               Darlin Priestly, M.D.    RHM/MEDQ  D:  03/05/2002  T:  03/06/2002  Job:  [id number]   cc:   Sherral Hammers, M.D.   Fredirick Maudlin, M.D.

## 2010-11-27 NOTE — Op Note (Signed)
   NAME:  Kristina Horton, Kristina Horton NO.:  [id number]   MEDICAL RECORD NO.:  [id number]                   PATIENT TYPE:  AMB   LOCATION:  SDC                                  FACILITY:  [facility]   PHYSICIAN:  Malva Limes, M.D.                 DATE OF BIRTH:  [birth year]   DATE OF PROCEDURE:  10/09/2002  DATE OF DISCHARGE:                                 OPERATIVE REPORT   PREOPERATIVE DIAGNOSES:  1. Dyspareunia.  2. Symptomatic rectovaginal fistula.   POSTOPERATIVE DIAGNOSES:  1. Dyspareunia.  2. Symptomatic rectovaginal fistula.   PROCEDURE:  Repair of rectovaginal fistula.   SURGEON:  Malva Limes, M.D.   ANESTHESIA:  Spinal.   ESTIMATED BLOOD LOSS:  25 mL.   COMPLICATIONS:  None.   ANTIBIOTICS:  Ancef 1 g.   DESCRIPTION OF PROCEDURE:  The patient was taken to the operating room,  where a spinal anesthetic was placed without difficulty.  She was placed in  the dorsal lithotomy position and prepped with Hibiclens.  She was draped in  the usual fashion.  The small fistula appeared to be arising at 4 o'clock on  the vestibule.  This area was injected with 0.25% lidocaine with  epinephrine.  The skin was opened around the fistula and the fistula was  cored out to an area just above the rectal mucosa.  The anal sphincter  appeared not to be involved.  The fistula appeared to be just proximal to  the anal sphincter.  Once this fistula was excised, the deeper layers were  closed using 2-0 Vicryl in interrupted fashion, pulling as much tissue as  possible into the void.  The entire defect was closed using 2-0 Rapide in  the deeper layers, and the skin was closed with 4-0 Rapide in a running  fashion.  The patient tolerated the procedure well.  She was taken to the  recovery room in stable condition.  She will be discharged to home with  Dilaudid p.r.n. as she has allergies to many other medicines.  She will also  be sent home with Keflex 500 mg q.i.d. for two  days and Diflucan 150 mg x1.                                               Malva Limes, M.D.    MA/MEDQ  D:  10/09/2002  T:  10/09/2002  Job:  [id number]

## 2010-11-27 NOTE — Procedures (Signed)
NAME:  Kristina Horton, SCHWEICKERT NO.:  [id number]   MEDICAL RECORD NO.:  [id number]          PATIENT TYPE:  OUT   LOCATION:  SLEEP CENTER                 FACILITY:  [facility]   PHYSICIAN:  Clinton D. Maple Hudson, MD, FCCP, FACPDATE OF BIRTH:  June 03[birth year] 1945   DATE OF STUDY:  05/08/2008                            NOCTURNAL POLYSOMNOGRAM   REFERRING PHYSICIAN:  Ramon Dredge L. Juanetta Gosling, M.D.   INDICATION FOR STUDY:  Hypersomnia with sleep apnea.   EPWORTH SLEEPINESS SCORE:  Epworth Sleepiness Score 17/24.  BMI 37.  Weight 185 pounds.  Height 59 inches.  Neck 17 inches.   HOME MEDICATIONS:  Charted and reviewed.   SLEEP ARCHITECTURE:  Split study protocol.  During the diagnostic phase,  total sleep time 121 minutes with sleep efficiency 86.8%.  Stage I was  4.1%.  Stage II 95.9%.  Stage III and REM were absent.  Sleep latency 8  minutes.  Awake after sleep onset 10.5 minutes.  Arousal index 20.7.  Bedtime medication included Ambien CR, aspirin, diazepam, Elmiron,  potassium, Topamax, and calcium.   RESPIRATORY DATA:  Split study protocol.  Apnea-hypopnea index (AHI)  15.8 per hour.  A total of 32 events were scored, all hypopneas.  Most  events were recorded while sleeping supine.  CPAP was titrated initially  at 4 CWP, but the patient could not tolerate CPAP.  She was switched to  bilevel/BiPAP to a final recommended pressure inspiratory 12/expiratory  7 CWP for an AHI of 6.9 per hour.  The technician reported the patient  difficult to titrate.  The patient wanted to pull mask off, wanted to  sit without the mask on, wanted several drinks of water during the  study, and generally anxious.   OXYGEN DATA:  Moderate snoring before CPAP with oxygen desaturation to a  nadir of 78%.  After final BiPAP, oxygen saturation mean held 90.9% on  room air.   CARDIAC DATA:  Sinus rhythm with occasional PVC.   MOVEMENT-PARASOMNIA:  Occasional limb jerk with arousal, 1 per hour,  insignificant.   IMPRESSION-RECOMMENDATION:  1. Mild-to-moderate obstructive sleep apnea/hypopnea syndrome, AHI      15.8 per hour with all events hypopneas and most recorded while      sleeping supine.  Moderate snoring with oxygen desaturation to a      nadir of 78%.  2. CPAP was not tolerated by the patient.  BiPAP was titrated to      recommend an initial trial at 12 CWP inspiratory, 7 CWP expiratory.      She chose a small [employer] forces training [employer] education [employer] with heated      humidifier.  3. Note that the patient took both Ambien CR and diazepam at bedtime.  4. Study was originally scheduled to provide for a multiple sleep      latency test to be done if she did not require titration.  She did      qualify for positive airway pressure titration.  If significant      daytime sleepiness persists and is      not explained by control of sleep apnea, or sleep hygiene, then      consider return  for multiple sleep latency tests as appropriate.      Clinton D. Maple Hudson, MD, [facility], FACP  Diplomate, Biomedical engineer of Sleep Medicine  Electronically Signed     CDY/MEDQ  D:  05/11/2008 10:12:41  T:  05/11/2008 22:56:05  Job:  [id number]

## 2010-11-27 NOTE — Op Note (Signed)
NAME:  Kristina Horton, Kristina Horton NO.:  [id number]   MEDICAL RECORD NO.:  [id number]                   PATIENT TYPE:  AMB   LOCATION:  DAY                                  FACILITY:  [facility]   PHYSICIAN:  Petra Kuba, M.D.                 DATE OF BIRTH:  Mar 06, 1946   DATE OF PROCEDURE:  12/03/2003  DATE OF DISCHARGE:                                 OPERATIVE REPORT   PROCEDURE:  EGD.   INDICATION:  Questionable hemoptysis versus hematemesis.  Longstanding upper  tract symptoms.  Consent was signed after risks, benefits, methods, options  thoroughly discussed multiple times in the past.   ANESTHESIA:  Anesthesia provided the medications.   DESCRIPTION OF PROCEDURE:  The video endoscope was inserted by direct  vision.  Quick look at the posterior pharynx revealed the endotracheal tube  in the proper position and a normal quick look at the posterior pharynx was  normal.  The scope passed into the esophagus which was normal.  There was a  tiny hiatal hernia.  Scope passed into the stomach and advanced through  normal antrum, normal pylorus, into a normal duodenal bulb, around the C-  loop to a normal second portion of the duodenum.  The scope was withdrawn  back to the bulb, and a good look there ruled out abnormalities in that  location.  The scope was withdrawn back to the stomach, and retroflexed.  Angularis, cardia, fundus, lesser and greater curve were normal on retroflex  visualization.  Straight visualization of the stomach did not reveal any  additional findings.  Air and water were suctioned.  The scope was slowly  withdrawn.  Again, a good look at the esophagus was normal.  Scope was  removed.  The patient tolerated the procedure well.  There was no obvious  immediate complication.   ENDOSCOPIC DIAGNOSES:  1. Tiny hiatal hernia .  2. Otherwise normal EGD.   PLAN:  1. ENT or pulmonary consult per Dr. Juanetta Gosling.  2. Happy to see back p.r.n.  3.  Otherwise, continue pump inhibitors.                                               Petra Kuba, M.D.    MEM/MEDQ  D:  12/03/2003  T:  12/03/2003  Job:  [id number]   cc:   Pollyann Savoy, M.D.  [address]  [place], [place] [address]  Fax: [phone]   Malva Limes, M.D.  [address]  [place]  [place] [address]  Fax: [phone]   Oneal Deputy. Juanetta Gosling, M.D.  [address]  [place]  [place] [address]  Fax: [phone]

## 2010-11-27 NOTE — Op Note (Signed)
NAME:  Kristina Horton, Kristina Horton NO.:  [id number]   MEDICAL RECORD NO.:  [id number]                   PATIENT TYPE:  AMB   LOCATION:  DAY                                  FACILITY:  [facility]   PHYSICIAN:  Petra Kuba, M.D.                 DATE OF BIRTH:  1945/08/27   DATE OF PROCEDURE:  12/03/2003  DATE OF DISCHARGE:                                 OPERATIVE REPORT   PROCEDURE:  Colonoscopy with biopsy.   INDICATION:  Screening.  Family history of colon cancer.  Personal history  of colon polyps.  Consent was signed after risks, benefits, methods, options  thoroughly discussed in the office on multiple occasions.   MEDICINES USED:  General anesthesia per anesthesia.   DESCRIPTION OF PROCEDURE:  Rectal inspection was pertinent for external  hemorrhoids, small.  Digital exam was negative.  Pediatric video adjustable  colonoscope was inserted and with some difficulty due to a tortuous sigmoid,  once through this area with rolling her on her back and abdominal pressure,  we were able to advance to the cecum.  No abnormality was seen on insertion.  The cecum was identified by the appendiceal orifice and the ileocecal valve.  In fact, the scope was inserted a very short ways into the terminal ileum  which was normal.  Photodocumentation was obtained.  The scope was slowly  withdrawn.  Prep was adequate.  There was some liquid stool that required  washing and suctioning.  On slow withdrawal through the colon, cecum,  ascending, transverse, majority of the descending were normal.  As we  withdrew around the sigmoid, a few tiny hyperplastic-appearing polyps were  seen and were cold biopsied and put in the same container.  Once back in the  rectum, anorectal pull-through and retroflexion confirmed some small  hemorrhoids.  The scope was straightened and readvanced a short ways up the  left side of the colon; air was suctioned and the scope removed.  The  patient tolerated  the procedure well.  There was no obvious immediate  complication.   ENDOSCOPIC DIAGNOSES:  1. Internal/external hemorrhoids.  2. Tortuous colon.  3. Few hyperplastic-appearing sigmoid tiny polyps, cold biopsied.  4. Otherwise, within normal limits to the end of the terminal ileum.   PLAN:  1. Await pathology.  2. Probably recheck colon screening in 5 years.  3. Consider a virtual colonoscopy if covered by insurance at that juncture.                                               Petra Kuba, M.D.    MEM/MEDQ  D:  12/03/2003  T:  12/03/2003  Job:  [id number]   cc:   Ramon Dredge L. Juanetta Gosling, M.D.  [address]  [place]  [place] [address]  Fax: [phone]   Pollyann Savoy, M.D.  [address]  [place], [place] [address]  Fax: [phone]   Malva Limes, M.D.  [address]  [place]  [place] [address]  Fax: [phone]

## 2010-11-27 NOTE — Op Note (Signed)
[facility]  Patient:    Kristina Horton, Kristina Horton Visit Number: [id number] MRN: [id number]          Service Type: GYN Location: [id number] Attending Physician:  Osborn Coho Dictated by:   Janeece Riggers Dareen Piano, M.D. Proc. Date: 09/25/01 Admit Date:  09/25/2001   CC:         Petra Kuba, M.D.   Operative Report  PREOPERATIVE DIAGNOSES:       1. Symptomatic uterine prolapse.                               2. Symptomatic rectocele.                               3. Uterine fibroid.  POSTOPERATIVE DIAGNOSES:      1. Symptomatic uterine prolapse.                               2. Symptomatic rectocele.                               3. Uterine fibroid.  PROCEDURE:                    1. Laparoscopic assisted vaginal hysterectomy.                               2. Lysis of adhesions in left pelvic side wall.                               3. Bilateral salpingo-oophorectomy.                               4. Posterior colporrhaphy.  SURGEON:                      Mark E. Dareen Piano, M.D.  ASSIST:                       Luvenia Redden, M.D.  ANESTHESIA:                   General.  ANTIBIOTICS:                  Ancef 1 g.  ESTIMATED BLOOD LOSS:         150 cc.  COMPLICATIONS:                None.  SPECIMENS:                    Cervix, uterus, ovaries, and fallopian tubes sent to pathology.  DRAINS:                       Foley to bed side drainage.  PACKING:                      Vaginal packing placed of 1 inch iodoform gauze.  FINDINGS:  The patient had normal appearing liver.  The appendix was not visualized in the pelvis.  Patient had a uterine fibroid approximately 3 cm in the left cornua.  There were adhesions between the pelvic side wall and the sigmoid colon at the pelvic rim.  Ovaries were normal bilaterally.  Fallopian tubes had evidence of past tubal ligation.  PROCEDURE:                    Patient was taken to the  operating room where she was placed in a dorsal supine position.  A general anesthetic was placed. Patient had previously had a spinal placed.  After the general anesthetic was administered patient was placed in the dorsal lithotomy position and was prepped with Hibiclens.  Her bladder was drained with a Red rubber catheter. A Hulka tenaculum was applied to the anterior cervical lip after the cervical os was dilated.  The umbilicus was then injected with 8 cc of 0.25% Marcaine. A vertical skin incision was made.  This was carried down to the fascia.  The fascia was then entered in the midline and taken superiorly and inferiorly. Parietoperitoneum was entered with blunt dissection.  Suture was then placed in the fascia and Hasson tenaculum was placed.  Patient was then placed in Trendelenburg.  Carbon dioxide 3 L was placed.  On examination there was no evidence of any abdominal adhesions from the previous surgery.  The patient had 5 mm ports placed under direct visualization in the left and right lower quadrants.  The tripolar instrument was then set up.  The adhesions involving the colon were then taken down with sharp dissection.  The infundibulopelvic ligament on the left was then isolated, cauterized, and transected.  The round ligament was cauterized and transected and the mesosalpinx cauterized and transected.  A similar procedure was performed on the opposite side.  At this point the procedure was taken down below.  A sterile weighted speculum was placed in the vagina.  Lidocaine 10 cc 1% with epinephrine was injected circumferentially around the cervix.  The posterior cul-de-sac was entered with sharp dissection.  The uterosacral ligaments were then bilaterally clamped, cut, and ligated with 0 Monocryl suture.  The anterior cul-de-sac was entered sharply.  Cardinal ligaments were serially clamped, cut, and ligated with 0 Monocryl suture.  The uterine vessels were bilaterally clamped,  cut, and ligated with 0 Monocryl suture.  The uterus was then inverted and the specimen removed.  All pedicles were examined and felt to be adequate. Uterosacral ligaments were then reapproximated in midline using 0 Monocryl suture.  The cuff and peritoneum were then closed using 2-0 Vicryl in a running locked fashion.  This concluded the hysterectomy.  At this point the posterior vagina and rectocele was injected with 1% lidocaine with epinephrine.  The perineum was then incised in a triangular fashion and the vaginal mucosa undermined in the midline all the way up to the vaginal cuff. The tissue was then dissected from the vaginal mucosa exposing the rectocele. Rectocele was closed using a 2-0 Vicryl suture in purse string fashion.  The connective tissue was then reapproximated in the midline using 2-0 Vicryl suture in a running fashion.  The excess vaginal mucosa was then excised and the vaginal mucosa closed using 2-0 Vicryl in a running fashion.  Vaginal packing was then placed.  Attention was then turned to the umbilicus.  The abdominal cavity was insufflated again with 3 L of carbon dioxide.  On examination there was  no evidence of any bleeding.  All pedicles appeared to be hemostatic.  This concluded the procedure.  Instruments were removed. Pneumoperitoneum released.  The 5 mm ports were closed with Steri-Strips.  The fascia and the umbilicus was closed with interrupted 0 Vicryl suture.  Skin was closed using 4-0 Vicryl in an interrupted fashion.  Patient tolerated procedure well and she was taken to the recovery room in stable condition. Instrument and lap counts were correct x2. Dictated by:   Janeece Riggers Dareen Piano, M.D. Attending Physician:  Osborn Coho DD:  09/25/01 TD:  09/26/01 Job: [id number]

## 2010-12-21 ENCOUNTER — Ambulatory Visit (HOSPITAL_COMMUNITY)
Admission: RE | Admit: 2010-12-21 | Discharge: 2010-12-21 | Disposition: A | Discharge status: Home / Self Care | Payer: BC Managed Care – PPO | Source: Ambulatory Visit | Attending: Pulmonary Disease | Admitting: Pulmonary Disease

## 2010-12-21 ENCOUNTER — Encounter (HOSPITAL_COMMUNITY): Payer: Self-pay

## 2010-12-21 ENCOUNTER — Other Ambulatory Visit (HOSPITAL_COMMUNITY): Payer: Self-pay | Admitting: Pulmonary Disease

## 2010-12-21 DIAGNOSIS — W19XXXA Unspecified fall, initial encounter: Secondary | ICD-10-CM

## 2010-12-21 DIAGNOSIS — R269 Unspecified abnormalities of gait and mobility: Secondary | ICD-10-CM | POA: Insufficient documentation

## 2010-12-21 DIAGNOSIS — M79609 Pain in unspecified limb: Secondary | ICD-10-CM | POA: Insufficient documentation

## 2010-12-21 DIAGNOSIS — M25519 Pain in unspecified shoulder: Secondary | ICD-10-CM | POA: Insufficient documentation

## 2010-12-21 DIAGNOSIS — R0789 Other chest pain: Secondary | ICD-10-CM | POA: Insufficient documentation

## 2010-12-21 DIAGNOSIS — M542 Cervicalgia: Secondary | ICD-10-CM | POA: Insufficient documentation

## 2010-12-21 DIAGNOSIS — R51 Headache: Secondary | ICD-10-CM | POA: Insufficient documentation

## 2010-12-21 DIAGNOSIS — M2010 Hallux valgus (acquired), unspecified foot: Secondary | ICD-10-CM | POA: Insufficient documentation

## 2010-12-21 DIAGNOSIS — T07XXXA Unspecified multiple injuries, initial encounter: Secondary | ICD-10-CM | POA: Insufficient documentation

## 2011-03-02 ENCOUNTER — Telehealth (HOSPITAL_COMMUNITY): Payer: Self-pay | Admitting: *Deleted

## 2011-03-02 ENCOUNTER — Other Ambulatory Visit (HOSPITAL_COMMUNITY): Payer: BC Managed Care – PPO

## 2011-03-02 NOTE — Telephone Encounter (Signed)
Her labs were good but ferritin going down-still needs repeat cbc and ferritin in 4 months.

## 2011-03-02 NOTE — Telephone Encounter (Addendum)
received call from pt. She wants you to review labs sent over from Dr.Hawkins 02/12/11. Chart is on your cart. Has an appt with you 8/27. Said if you are only going to tell her that labs look good do you really need to see her. If not cancel her appt and reschedule as needed.

## 2011-03-03 ENCOUNTER — Other Ambulatory Visit (HOSPITAL_COMMUNITY): Payer: Self-pay | Admitting: *Deleted

## 2011-03-03 DIAGNOSIS — D649 Anemia, unspecified: Secondary | ICD-10-CM

## 2011-03-08 ENCOUNTER — Ambulatory Visit (HOSPITAL_COMMUNITY): Payer: BC Managed Care – PPO | Admitting: Oncology

## 2011-03-30 ENCOUNTER — Institutional Professional Consult (permissible substitution): Payer: BC Managed Care – PPO | Admitting: Emergency Medicine

## 2011-04-13 ENCOUNTER — Encounter: Payer: Self-pay | Admitting: Pulmonary Disease

## 2011-04-14 ENCOUNTER — Ambulatory Visit (INDEPENDENT_AMBULATORY_CARE_PROVIDER_SITE_OTHER): Payer: BC Managed Care – PPO | Admitting: Emergency Medicine

## 2011-04-14 ENCOUNTER — Encounter: Payer: Self-pay | Admitting: Emergency Medicine

## 2011-04-14 VITALS — BP 116/72 | HR 109 | Temp 98.0°F | Ht <= 58 in | Wt 179.0 lb

## 2011-04-14 DIAGNOSIS — M797 Fibromyalgia: Secondary | ICD-10-CM

## 2011-04-14 DIAGNOSIS — IMO0001 Reserved for inherently not codable concepts without codable children: Secondary | ICD-10-CM

## 2011-04-14 DIAGNOSIS — R059 Cough, unspecified: Secondary | ICD-10-CM

## 2011-04-14 DIAGNOSIS — N3010 Interstitial cystitis (chronic) without hematuria: Secondary | ICD-10-CM

## 2011-04-14 DIAGNOSIS — M1990 Unspecified osteoarthritis, unspecified site: Secondary | ICD-10-CM | POA: Insufficient documentation

## 2011-04-14 DIAGNOSIS — J309 Allergic rhinitis, unspecified: Secondary | ICD-10-CM | POA: Insufficient documentation

## 2011-04-14 DIAGNOSIS — I10 Essential (primary) hypertension: Secondary | ICD-10-CM | POA: Insufficient documentation

## 2011-04-14 DIAGNOSIS — K589 Irritable bowel syndrome without diarrhea: Secondary | ICD-10-CM

## 2011-04-14 DIAGNOSIS — R05 Cough: Secondary | ICD-10-CM | POA: Insufficient documentation

## 2011-04-14 DIAGNOSIS — R053 Chronic cough: Secondary | ICD-10-CM | POA: Insufficient documentation

## 2011-04-14 NOTE — Assessment & Plan Note (Addendum)
Suspect large component of VCD here, with superimposed allergies, GERD. No evidence AFL on PFT done in 2009.   - continue nasal steroid and zyrtec - restart NSW's - continue aciPhex bid - repeat CXR now, consider CT scan if any abnormality  - consider FOB to eval airways.  - rov in 1 month

## 2011-04-14 NOTE — Patient Instructions (Signed)
Continue nasal steroid and zyrtec as you are taking them Restart Nasal Saline washes every day Continue aciPhex twice a day Repeat CXR now, consider CT scan depending on the results We will consider a bronchoscopy depending on your other evaluation.  Follow up in 1 month to review your films and your status

## 2011-04-14 NOTE — Progress Notes (Signed)
  Subjective:    Patient ID: Kristina Horton, female    DOB: 09/14/1945, 65 y.o.   MRN: [id number]  HPI 65 yo woman, former smoker (normal PFT 06/2008), IBS, fibromyalgia (Deveshwar), allergic rhinitis, OSA (doesn't use her PPV, PSG 05/08/08), HTN.  She is referred for chronic cough. She tells me that she started coughing in January 2012, bothers her when she is warm, when she exerts or goes outside. She believes that there is an allergy component - can't take allergy shots. She is on zyrtec, nasocort. Has been treated with abx (doxy and avelox), no steroids. She has had CXR, no CT scan chest.  She has GERD, seems to be managed on Aciphex bid.    Review of Systems As above      Objective:   Physical Exam Gen: Chronically ill,  in no distress, depressed affect  ENT: No lesions,  mouth clear,  oropharynx clear, no postnasal drip  Neck: No JVD, no TMG, no carotid bruits  Lungs: No use of accessory muscles, no dullness to percussion, clear without rales or rhonchi  Cardiovascular: RRR, heart sounds normal, no murmur or gallops, no peripheral edema  Musculoskeletal: No deformities, no cyanosis or clubbing  Neuro: alert, non focal  Skin: Warm, no lesions or rashes  Past Medical History  Diagnosis Date  . Hypertension   . Cancer   . IBS (irritable bowel syndrome)   . IC (interstitial cystitis)   . Allergic rhinitis   . Fibromyalgia   . Osteoarthritis      Family History  Problem Relation Age of Onset  . COPD Mother   . Heart failure Mother   . Heart failure Father   . Alcohol abuse Mother   . Alcohol abuse Brother      History   Social History  . Marital Status: Married    Spouse Name: N/A    Number of Children: N/A  . Years of Education: N/A   Occupational History  . Not on file.   Social History Main Topics  . Smoking status: Former Smoker -- 3.0 packs/day for 18 years    Types: Cigarettes    Quit date: 07/12/1978  . Smokeless tobacco: Never Used  .  Alcohol Use: No  . Drug Use: No  . Sexually Active: Not on file   Other Topics Concern  . Not on file   Social History Narrative  . No narrative on file     Allergies  Allergen Reactions  . Demerol   . Iodine   . Shellfish-Derived Products   . Sulfa Drugs Cross Reactors      No outpatient prescriptions prior to visit.       Assessment & Plan:  Chronic cough - continue nasal steroid and zyrtec - restart NSW's - continue aciPhex bid - repeat CXR now, consider CT scan if any abnormality  - consider FOB to eval airways.  - rov in 1 month

## 2011-04-16 ENCOUNTER — Telehealth: Payer: Self-pay | Admitting: Emergency Medicine

## 2011-04-16 DIAGNOSIS — R053 Chronic cough: Secondary | ICD-10-CM

## 2011-04-16 DIAGNOSIS — R05 Cough: Secondary | ICD-10-CM

## 2011-04-16 LAB — BLOOD GAS, ARTERIAL
Acid-Base Excess: 1.3 mmol/L (ref 0.0–2.0)
Bicarbonate: 25.7 mEq/L — ABNORMAL HIGH (ref 20.0–24.0)
FIO2: 0.21 %
O2 Saturation: 92 %
Patient temperature: 37
TCO2: 23.5 mmol/L (ref 0–100)
pCO2 arterial: 42.5 mmHg (ref 35.0–45.0)
pH, Arterial: 7.398 (ref 7.350–7.400)
pO2, Arterial: 72.8 mmHg — ABNORMAL LOW (ref 80.0–100.0)

## 2011-04-16 NOTE — Telephone Encounter (Signed)
I spoke with pt and she states she would now like her cxr to be done at [facility]. Per lori okay to do and order sent. Pt is aware she can go and have cxr done

## 2011-04-19 ENCOUNTER — Other Ambulatory Visit (HOSPITAL_COMMUNITY): Payer: Self-pay | Admitting: Pulmonary Disease

## 2011-04-19 ENCOUNTER — Ambulatory Visit (HOSPITAL_COMMUNITY)
Admission: RE | Admit: 2011-04-19 | Discharge: 2011-04-19 | Disposition: A | Discharge status: Home / Self Care | Payer: BC Managed Care – PPO | Source: Ambulatory Visit | Attending: Emergency Medicine | Admitting: Emergency Medicine

## 2011-04-19 DIAGNOSIS — W19XXXA Unspecified fall, initial encounter: Secondary | ICD-10-CM | POA: Insufficient documentation

## 2011-04-19 DIAGNOSIS — R053 Chronic cough: Secondary | ICD-10-CM

## 2011-04-19 DIAGNOSIS — R0789 Other chest pain: Secondary | ICD-10-CM | POA: Insufficient documentation

## 2011-04-19 DIAGNOSIS — T07XXXA Unspecified multiple injuries, initial encounter: Secondary | ICD-10-CM | POA: Insufficient documentation

## 2011-04-19 DIAGNOSIS — M25529 Pain in unspecified elbow: Secondary | ICD-10-CM | POA: Insufficient documentation

## 2011-04-19 DIAGNOSIS — M25559 Pain in unspecified hip: Secondary | ICD-10-CM | POA: Insufficient documentation

## 2011-04-19 DIAGNOSIS — R05 Cough: Secondary | ICD-10-CM

## 2011-04-19 DIAGNOSIS — M542 Cervicalgia: Secondary | ICD-10-CM | POA: Insufficient documentation

## 2011-05-21 ENCOUNTER — Ambulatory Visit: Payer: BC Managed Care – PPO | Admitting: Emergency Medicine

## 2011-05-28 ENCOUNTER — Other Ambulatory Visit (HOSPITAL_COMMUNITY): Payer: Self-pay | Admitting: *Deleted

## 2011-06-04 ENCOUNTER — Ambulatory Visit (HOSPITAL_COMMUNITY): Payer: BC Managed Care – PPO

## 2011-06-04 ENCOUNTER — Other Ambulatory Visit (HOSPITAL_COMMUNITY): Payer: BC Managed Care – PPO

## 2011-06-15 ENCOUNTER — Ambulatory Visit: Payer: BC Managed Care – PPO | Admitting: Emergency Medicine

## 2011-06-16 ENCOUNTER — Encounter (HOSPITAL_COMMUNITY): Payer: BC Managed Care – PPO

## 2011-06-16 ENCOUNTER — Other Ambulatory Visit (HOSPITAL_COMMUNITY): Payer: BC Managed Care – PPO

## 2011-06-25 ENCOUNTER — Ambulatory Visit: Payer: BC Managed Care – PPO | Admitting: Emergency Medicine

## 2011-06-29 ENCOUNTER — Encounter (HOSPITAL_COMMUNITY): Payer: BC Managed Care – PPO | Attending: Oncology

## 2011-06-29 DIAGNOSIS — D649 Anemia, unspecified: Secondary | ICD-10-CM

## 2011-06-29 LAB — CBC
HCT: 40.2 % (ref 36.0–46.0)
Platelets: 243 10*3/uL (ref 150–400)
RBC: 4.32 MIL/uL (ref 3.87–5.11)
RDW: 12.8 % (ref 11.5–15.5)
WBC: 7.3 10*3/uL (ref 4.0–10.5)

## 2011-07-02 ENCOUNTER — Ambulatory Visit (HOSPITAL_COMMUNITY): Payer: BC Managed Care – PPO | Admitting: Oncology

## 2011-08-04 ENCOUNTER — Ambulatory Visit (HOSPITAL_COMMUNITY): Payer: BC Managed Care – PPO | Admitting: Oncology

## 2011-08-16 ENCOUNTER — Encounter (HOSPITAL_COMMUNITY): Payer: Medicare Other | Attending: Oncology | Admitting: Oncology

## 2011-08-16 VITALS — BP 129/81 | HR 114 | Temp 98.2°F | Ht <= 58 in | Wt 183.4 lb

## 2011-08-16 DIAGNOSIS — D509 Iron deficiency anemia, unspecified: Secondary | ICD-10-CM | POA: Insufficient documentation

## 2011-08-16 NOTE — Progress Notes (Signed)
This office note has been dictated.

## 2011-08-16 NOTE — Progress Notes (Signed)
CC:   [name], M.D. Petra Kuba, M.D.  DIAGNOSIS:  Iron deficiency anemia presenting as a microcytic process status post IV Feraheme infusion on 07/17/2010 and 07/24/2010 with an excellent response.  Her ferritin was 14 pretherapy, hemoglobin 10, after therapy hemoglobin 14.3 g in August 2012.  Ferritin went up to 734, then down to 152, but June 29, 2011 her ferritin was still quite good at 168.  Her hemoglobin was 13.9 g.  Kristina Horton is here today and unfortunately she tells [place] that her daughter was killed in a car wreck in October 10 o'clock at night, and of course they had to bury her.  Kristina Horton is her closest living relative.  Her daughter had 2 children who were living with her daughter's ex-husband at the time.  Kristina Horton is obviously still shook up.  Her labs though in December were good.  We are going to check them again in March and probably see her a couple of months after that, but will of course base that upon her lab work in 4-5 weeks.  She feels physically okay, no pica, so will see what things are like in March.    ______________________________ Ladona Horns. Mariel Sleet, MD ESN/MEDQ  D:  08/16/2011  T:  08/16/2011  Job:  [id number]

## 2011-08-24 ENCOUNTER — Emergency Department (HOSPITAL_COMMUNITY): Payer: Medicare Other

## 2011-08-24 ENCOUNTER — Emergency Department (HOSPITAL_COMMUNITY)
Admission: EM | Admit: 2011-08-24 | Discharge: 2011-08-24 | Disposition: A | Discharge status: Home / Self Care | Payer: Medicare Other | Attending: Emergency Medicine | Admitting: Emergency Medicine

## 2011-08-24 ENCOUNTER — Encounter (HOSPITAL_COMMUNITY): Payer: Self-pay

## 2011-08-24 DIAGNOSIS — S42213A Unspecified displaced fracture of surgical neck of unspecified humerus, initial encounter for closed fracture: Secondary | ICD-10-CM

## 2011-08-24 DIAGNOSIS — I10 Essential (primary) hypertension: Secondary | ICD-10-CM | POA: Insufficient documentation

## 2011-08-24 DIAGNOSIS — W010XXA Fall on same level from slipping, tripping and stumbling without subsequent striking against object, initial encounter: Secondary | ICD-10-CM | POA: Insufficient documentation

## 2011-08-24 DIAGNOSIS — M25429 Effusion, unspecified elbow: Secondary | ICD-10-CM | POA: Insufficient documentation

## 2011-08-24 DIAGNOSIS — Z79899 Other long term (current) drug therapy: Secondary | ICD-10-CM | POA: Insufficient documentation

## 2011-08-24 DIAGNOSIS — M25421 Effusion, right elbow: Secondary | ICD-10-CM

## 2011-08-24 DIAGNOSIS — IMO0001 Reserved for inherently not codable concepts without codable children: Secondary | ICD-10-CM | POA: Insufficient documentation

## 2011-08-24 MED ORDER — IBUPROFEN 200 MG PO TABS
400.0000 mg | ORAL_TABLET | Freq: Once | ORAL | Status: AC
  Administered 2011-08-24: 400 mg via ORAL
  Filled 2011-08-24: qty 2

## 2011-08-24 MED ORDER — HYDROMORPHONE HCL PF 2 MG/ML IJ SOLN
2.0000 mg | Freq: Once | INTRAMUSCULAR | Status: AC
  Administered 2011-08-24: 2 mg via INTRAMUSCULAR

## 2011-08-24 MED ORDER — OXYCODONE-ACETAMINOPHEN 5-325 MG PO TABS: 1.0000 | ORAL_TABLET | ORAL | Status: AC | PRN

## 2011-08-24 MED ORDER — HYDROMORPHONE HCL PF 2 MG/ML IJ SOLN
2.0000 mg | Freq: Once | INTRAMUSCULAR | Status: AC
  Administered 2011-08-24: 2 mg via INTRAMUSCULAR
  Filled 2011-08-24: qty 1

## 2011-08-24 MED ORDER — HYDROMORPHONE HCL PF 2 MG/ML IJ SOLN
2.0000 mg | Freq: Once | INTRAMUSCULAR | Status: DC
  Filled 2011-08-24: qty 1

## 2011-08-24 NOTE — ED Notes (Signed)
Pt return from xray.

## 2011-08-24 NOTE — ED Notes (Signed)
Patient fell this pm landing on right shoulder and knee. Obvious swelling to both, patient arrived with sling in place, positive distal pulses, crying on asssessment

## 2011-08-24 NOTE — ED Notes (Signed)
Pt waiting for ortho tech, then pt can be discharge

## 2011-08-24 NOTE — ED Notes (Signed)
Patient transported to X-ray 

## 2011-08-24 NOTE — ED Provider Notes (Signed)
History    66 year old female with right upper extremity pain. Patient had a mechanical fall. She tripped and fell onto her right side. Stomach pain in her upper arm and shoulder. Does not think she hit her head. Denies headache, neck or back pain. No numbness, tingling or loss strength. No visual complaints. Baseline mental status per husband. Not on blood thinning medication.  CSN: [id number]  Arrival date & time 08/24/11  1541   First MD Initiated Contact with Patient 08/24/11 1614      No chief complaint on file.   (Consider location/radiation/quality/duration/timing/severity/associated sxs/prior treatment) HPI  Past Medical History  Diagnosis Date  . Hypertension   . Cancer   . IBS (irritable bowel syndrome)   . IC (interstitial cystitis)   . Allergic rhinitis   . Fibromyalgia   . Osteoarthritis     Past Surgical History  Procedure Date  . Spinal fusion 2010    C2-T2, done in [place]  . Vesicovaginal fistula closure w/ tah 1998  . Appendectomy 1974  . Breast surgery 1962    benign rumor  . Thyroidectomy, partial   . Vesico-vaginal fistula repair 1997    Family History  Problem Relation Age of Onset  . COPD Mother   . Heart failure Mother   . Heart failure Father   . Alcohol abuse Mother   . Alcohol abuse Brother     History  Substance Use Topics  . Smoking status: Former Smoker -- 3.0 packs/day for 18 years    Types: Cigarettes    Quit date: 07/12/1978  . Smokeless tobacco: Never Used  . Alcohol Use: No    OB History    Grav Para Term Preterm Abortions TAB SAB Ect Mult Living                  Review of Systems   Review of symptoms negative unless otherwise noted in HPI.   Allergies  Demerol; Iodine; Shellfish-derived products; and Sulfa drugs cross reactors  Home Medications   Current Outpatient Rx  Name Route Sig Dispense Refill  . ACYCLOVIR 400 MG PO TABS Oral Take 400 mg by mouth 2 (two) times daily.      Marland Kitchen ALPRAZOLAM 0.25 MG PO  TABS Oral Take 0.25 mg by mouth 2 (two) times daily.      Marland Kitchen CALCIUM CITRATE-VITAMIN D 315-200 MG-UNIT PO TABS Oral Take 2 tablets by mouth 2 (two) times daily.      . BUPROPION HCL ER (XL) 150 MG PO TB24 Oral Take 450 mg by mouth daily. Pt takes 3 tabs 450 mg dose    . CETIRIZINE HCL 10 MG PO TABS Oral Take 10 mg by mouth daily.      Marland Kitchen CIPROFLOXACIN HCL 500 MG PO TABS Oral Take 500 mg by mouth 2 (two) times daily.    Marland Kitchen CLINDINIUM-CHLORDIAZEPOXIDE 2.5-5 MG PO CAPS Oral Take 1 capsule by mouth 3 (three) times daily as needed.      Marland Kitchen CLOBETASOL PROPIONATE 0.05 % EX CREA Topical Apply 1 application topically as needed.      Marland Kitchen COENZYME Q10 30 MG PO CAPS Oral Take 30 mg by mouth daily.      Marland Kitchen DIAZEPAM 5 MG PO TABS Oral Take 5 mg by mouth daily.      . DULOXETINE HCL 60 MG PO CPEP Oral Take 60 mg by mouth daily.      Marland Kitchen FLUCONAZOLE 150 MG PO TABS Oral Take 150 mg by mouth daily as needed.      Marland Kitchen  HYDROCORTISONE 2.5 % EX CREA Topical Apply 1 application topically 2 (two) times daily as needed.      Marland Kitchen HYOSCYAMINE SULFATE 0.125 MG PO TABS Oral Take 0.125 mg by mouth every 4 (four) hours as needed.      Marland Kitchen LEVOTHYROXINE SODIUM 75 MCG PO TABS Oral Take 75 mcg by mouth daily.      Marland Kitchen MAGNESIUM CITRATE PO Oral Take 1 tablet by mouth daily.      Marland Kitchen MAGNESIUM MALATE POWD Does not apply 900 mg by Does not apply route daily.      . MELOXICAM 7.5 MG PO TABS Oral Take 15 mg by mouth daily.      Marland Kitchen METHOCARBAMOL 500 MG PO TABS Oral Take 500 mg by mouth 4 (four) times daily.      . METHYLPHENIDATE HCL 20 MG PO TABS Oral Take 20 mg by mouth 2 (two) times daily.      Marland Kitchen ONDANSETRON HCL 8 MG PO TABS Oral Take by mouth every 8 (eight) hours as needed.      . OXYCODONE HCL 5 MG PO CAPS Oral Take 5 mg by mouth every 4 (four) hours as needed.    . OXYCODONE HCL ER 10 MG PO TB12 Oral Take 10 mg by mouth every 4 (four) hours as needed.     Marland Kitchen OXYMORPHONE HCL ER 20 MG PO TB12 Oral Take 40 mg by mouth every 12 (twelve) hours.      Marland Kitchen  PENTOSAN POLYSULFATE SODIUM 100 MG PO CAPS Oral Take 200 mg by mouth 2 (two) times daily.      Marland Kitchen PHENAZOPYRIDINE HCL 100 MG PO TABS Oral Take 100 mg by mouth 3 (three) times daily as needed.    Marland Kitchen POTASSIUM CHLORIDE 10 MEQ PO TBCR Oral Take 20 mEq by mouth 5 (five) times daily.      Marland Kitchen RABEPRAZOLE SODIUM 20 MG PO TBEC Oral Take 40 mg by mouth daily.      . THIAMINE HCL 100 MG PO TABS Oral Take 100 mg by mouth daily.      . TOPIRAMATE 50 MG PO TABS Oral Take 50 mg by mouth 3 (three) times daily.      . TRIAMCINOLONE ACETONIDE 55 MCG/ACT NA INHA Nasal Place 2 sprays into the nose daily as needed.      . TRIAMTERENE-HCTZ 75-50 MG PO TABS Oral Take 1 tablet by mouth daily.      Marland Kitchen VALSARTAN-HYDROCHLOROTHIAZIDE 160-12.5 MG PO TABS Oral Take 1 tablet by mouth daily.      Marland Kitchen VITAMIN E 400 UNITS PO CAPS Oral Take 400 Units by mouth daily.      Marland Kitchen ZOLPIDEM TARTRATE 5 MG PO TABS Oral Take 5 mg by mouth at bedtime as needed.        BP 147/87  Pulse 106  Temp(Src) 98.5 F (36.9 C) (Oral)  Resp 18  SpO2 96%  Physical Exam  Nursing note and vitals reviewed. Constitutional: She appears well-developed.       Laying on L side on stretcher.  HENT:  Head: Normocephalic and atraumatic.  Eyes: Conjunctivae are normal. Pupils are equal, round, and reactive to light. Right eye exhibits no discharge. Left eye exhibits no discharge.  Neck: Normal range of motion. Neck supple.  Cardiovascular: Normal rate, regular rhythm and normal heart sounds.  Exam reveals no gallop and no friction rub.   No murmur heard. Pulmonary/Chest: Effort normal and breath sounds normal. No respiratory distress.  Abdominal: Soft. She exhibits  no distension. There is no tenderness.  Musculoskeletal:       Exam somewhat limited due to body habitus. Right shoulder is diffusely tender to palpation. Patient now on a range her shoulder secondary to pain. Tenderness along the mid to proximal humerus. Diffuse tenderness of the elbow. No effusion  appreciated. Overlying skin is grossly normal in appearance. Pain with ROM. Patient neurovascular intact distally.  Neurological: She is alert.  Skin: Skin is warm and dry. She is not diaphoretic.  Psychiatric: She has a normal mood and affect. Her behavior is normal. Thought content normal.    ED Course  Procedures (including critical care time)  Labs Reviewed - No data to display Dg Shoulder Right  08/24/2011  *RADIOLOGY REPORT*  Clinical Data: 66 year old female status post fall with pain.  RIGHT SHOULDER - 2+ VIEW  Comparison: Right humerus study reported separately.  Findings: No glenohumeral joint dislocation.  Right scapula and visualized right clavicle appear intact.  Nondisplaced right humeral neck fracture suspected with cortical irregularity laterally.  Extensive postoperative changes in the visualized cervical spine.  Visualized right ribs appear intact.  IMPRESSION: 1.  Nondisplaced right humeral neck fracture. 2. No glenohumeral joint dislocation.  Original Report Authenticated By: Harley Hallmark, M.D.   Dg Elbow Complete Right  08/24/2011  *RADIOLOGY REPORT*  Clinical Data: 66 year old female with fall and pain.  RIGHT ELBOW - COMPLETE 3+ VIEW  Comparison: Right humerus series from the same day reported separately.  Findings: Positive elbow joint effusion.  Widespread osseous irregularity at the right elbow joint involving the articular surfaces of the distal humerus, radial head, and proximal ulna.  To the extent these changes are degenerative.  Superimposed acute fracture is difficult to exclude at the supracondylar humerus and radial head.  No dislocation.  IMPRESSION: Evidence of right elbow joint effusion, and bone detail limited by extensive degenerative change with spurring.  Nondisplaced right radial head fracture or supracondylar humerus fracture cannot be excluded.  Original Report Authenticated By: Harley Hallmark, M.D.   Dg Humerus Right  08/24/2011  *RADIOLOGY REPORT*   Clinical Data: 66 year old female status post fall with pain.  RIGHT HUMERUS - 2+ VIEW  Comparison: Right shoulder and elbow series from the same day.  Findings: Mildly impacted but otherwise nondisplaced right humeral neck fracture re-identified.  Irregularity at the right elbow again noted, see dedicated elbow series.  The right humeral shaft is intact.  Visualized right ribs appear intact.  IMPRESSION: Right humeral neck fracture re-identified, see shoulder series. See also right elbow series.  No right humeral shaft fracture.  Original Report Authenticated By: Ulla Potash III, M.D.     1. Effusion of elbow joint, right   2. Fx humeral neck       MDM  65y female with right upper extremity pain after fall. Elbow films are difficult interpret secondary to diffuse degenerative changes. There is a joint effusion though so consider radial head fracture or supracondylar fracture. Will treat as if there is a fracture and splint and sling. Patient also has a nondisplaced humeral neck fracture. Splint and sling as well. Pain medication and orthopedic referral.        Raeford Razor, MD 08/26/11 1320

## 2011-08-24 NOTE — ED Notes (Signed)
Vitals entered on incorrect patient.

## 2011-09-20 ENCOUNTER — Encounter (HOSPITAL_COMMUNITY): Payer: Medicare Other | Attending: Oncology

## 2011-09-20 DIAGNOSIS — D509 Iron deficiency anemia, unspecified: Secondary | ICD-10-CM

## 2011-09-20 LAB — CBC
HCT: 40.2 % (ref 36.0–46.0)
Hemoglobin: 13.4 g/dL (ref 12.0–15.0)
RBC: 4.28 MIL/uL (ref 3.87–5.11)
WBC: 6.8 10*3/uL (ref 4.0–10.5)

## 2011-09-20 NOTE — Progress Notes (Signed)
Labs drawn today for cbc,ferr 

## 2011-09-21 LAB — FERRITIN: Ferritin: 123 ng/mL (ref 10–291)

## 2011-09-22 ENCOUNTER — Telehealth (HOSPITAL_COMMUNITY): Payer: Self-pay | Admitting: *Deleted

## 2011-09-22 DIAGNOSIS — D509 Iron deficiency anemia, unspecified: Secondary | ICD-10-CM

## 2011-09-22 NOTE — Telephone Encounter (Signed)
Message left on answering machine for pt to call office to schedule lab work and md appt in June.

## 2011-09-27 ENCOUNTER — Ambulatory Visit (HOSPITAL_COMMUNITY)
Admission: RE | Admit: 2011-09-27 | Discharge: 2011-09-27 | Disposition: A | Discharge status: Home / Self Care | Payer: Managed Care, Other (non HMO) | Source: Ambulatory Visit | Attending: Pulmonary Disease | Admitting: Pulmonary Disease

## 2011-09-27 ENCOUNTER — Other Ambulatory Visit (HOSPITAL_COMMUNITY): Payer: Self-pay | Admitting: Pulmonary Disease

## 2011-09-27 DIAGNOSIS — R059 Cough, unspecified: Secondary | ICD-10-CM

## 2011-09-27 DIAGNOSIS — R05 Cough: Secondary | ICD-10-CM

## 2011-09-27 DIAGNOSIS — R509 Fever, unspecified: Secondary | ICD-10-CM

## 2011-09-27 DIAGNOSIS — I517 Cardiomegaly: Secondary | ICD-10-CM | POA: Insufficient documentation

## 2011-09-28 ENCOUNTER — Telehealth: Payer: Self-pay | Admitting: Critical Care Medicine

## 2011-09-28 NOTE — Telephone Encounter (Signed)
66 year old F with MMP including fibromyalgia, IBS, and hypertension followed by Dr. Delton Coombes for chronic cough.  The patient states she has been sick for several days.  Her cough has worsened and it is now productive of greenish sputum.  She has been seen by her primary care MD.  A CXR at [facility] on 3/18 was clear. A sputum specimen was also taken but results are not available.   She has a low grade temp of 100.9 and chills/body aches but no N/V/D.  She has ongoing chest pain from her cough.  She is currently on doxycycline which she has taken 4 days along with meds for cough suppression and pain.  She is fully oriented and can talk in complete sentences.  There is no audible wheezing. Assessment: URI/Bronchitis, possible flu syndrome Plan: Reassured patient Continue current meds Start Tamiflu (she has a prescription at home) If symptoms worsen may go to ED Recommend that staff call in AM to check on patient

## 2011-09-29 ENCOUNTER — Telehealth: Payer: Self-pay | Admitting: Emergency Medicine

## 2011-09-29 NOTE — Telephone Encounter (Signed)
ATC x2.  Line busy.  WCB. 

## 2011-09-29 NOTE — Telephone Encounter (Signed)
Please call her to set up OV

## 2011-09-29 NOTE — Telephone Encounter (Signed)
Dr. Delton Coombes will be out of the office for several weeks and the pt was offered several appts with other providers but nothing worked for her. She requested to be seen in the [place] office. Pt scheduled to be seen for work-in appt with Dr. Delford Field in [place] on 4/1 @ 12pm. She was given the address and phone number to that office. I instructed her to call for a sooner appt or seek emergency help if her sxs got worse. Pt verbalized understanding. understanding.

## 2011-09-29 NOTE — Telephone Encounter (Signed)
Leslye Peer., MD 09/29/2011 2:36 PM Signed  Please call her to set up Apolinar Junes, MD 09/28/2011 11:56 PM Signed  66 year old F with MMP including fibromyalgia, IBS, and hypertension followed by Dr. Delton Coombes for chronic cough. The patient states she has been sick for several days. Her cough has worsened and it is now productive of greenish sputum. She has been seen by her primary care MD. A [facility] at [facility] on 3/18 was clear. A sputum specimen was also taken but results are not available. She has a low grade temp of 100.9 and chills/body aches but no N/V/D. She has ongoing chest pain from her cough. She is currently on doxycycline which she has taken 4 days along with meds for cough suppression and pain. She is fully oriented and can talk in complete sentences. There is no audible wheezing.  Assessment: URI/Bronchitis, possible flu syndrome  Plan:  Reassured patient  Continue current meds  Start Tamiflu (she has a prescription at home)  If symptoms worsen may go to ED  Recommend that staff call in AM to check on patient

## 2011-10-11 ENCOUNTER — Encounter: Payer: Self-pay | Admitting: Critical Care Medicine

## 2011-10-11 ENCOUNTER — Ambulatory Visit (INDEPENDENT_AMBULATORY_CARE_PROVIDER_SITE_OTHER): Payer: Medicare Other | Admitting: Critical Care Medicine

## 2011-10-11 VITALS — BP 118/64 | HR 105 | Temp 98.1°F | Ht 58.5 in | Wt 183.5 lb

## 2011-10-11 DIAGNOSIS — R053 Chronic cough: Secondary | ICD-10-CM

## 2011-10-11 DIAGNOSIS — R059 Cough, unspecified: Secondary | ICD-10-CM

## 2011-10-11 DIAGNOSIS — R05 Cough: Secondary | ICD-10-CM

## 2011-10-11 MED ORDER — METHYLPREDNISOLONE ACETATE 80 MG/ML IJ SUSP
120.0000 mg | Freq: Once | INTRAMUSCULAR | Status: AC
  Administered 2011-10-11: 120 mg via INTRAMUSCULAR

## 2011-10-11 MED ORDER — CETIRIZINE HCL 10 MG PO TABS: ORAL_TABLET | ORAL | Status: DC

## 2011-10-11 MED ORDER — AMOXICILLIN-POT CLAVULANATE 875-125 MG PO TABS: 1.0000 | ORAL_TABLET | Freq: Two times a day (BID) | ORAL | Status: AC

## 2011-10-11 NOTE — Progress Notes (Signed)
Subjective:    Patient ID: Kristina Horton, female    DOB: [birth year], 66 y.o.   MRN: [id number]  HPI  66 y.o. woman, former smoker (normal PFT 06/2008), IBS, fibromyalgia (Deveshwar), allergic rhinitis, OSA (doesn't use her PPV, PSG 05/08/08), HTN.  She is referred for chronic cough. She tells me that she started coughing in January 2012, bothers her when she is warm, when she exerts or goes outside. She believes that there is an allergy component - can't take allergy shots. She is on zyrtec, nasocort. Has been treated with abx (doxy and avelox), no steroids. She has had CXR, no CT scan chest.  She has GERD, seems to be managed on Aciphex bid.   10/11/2011  RB office patient not seen since 10/12 Chronic cough, worse if windy and allergy flare.  Had allergy tests in the past Did have fever 1 week ago, took doxy and fever went away.  Still brings up mucus.  Mucus is yellow.  Pt is still dyspneic with exertion  Past Medical History  Diagnosis Date  . Hypertension   . Cancer   . IBS (irritable bowel syndrome)   . IC (interstitial cystitis)   . Allergic rhinitis   . Fibromyalgia   . Osteoarthritis      Family History  Problem Relation Age of Onset  . COPD Mother   . Heart failure Mother   . Heart failure Father   . Alcohol abuse Mother   . Alcohol abuse Brother      History   Social History  . Marital Status: Married    Spouse Name: N/A    Number of Children: N/A  . Years of Education: N/A   Occupational History  . Not on file.   Social History Main Topics  . Smoking status: Former Smoker -- 3.0 packs/day for 18 years    Types: Cigarettes    Quit date: 07/12/1978  . Smokeless tobacco: Never Used  . Alcohol Use: No  . Drug Use: No  . Sexually Active: Not on file   Other Topics Concern  . Not on file   Social History Narrative  . No narrative on file     Allergies  Allergen Reactions  . Demerol   . Iodine   . Shellfish-Derived Products   . Sulfa Drugs Cross  Reactors      Outpatient Prescriptions Prior to Visit  Medication Sig Dispense Refill  . acyclovir (ZOVIRAX) 400 MG tablet Take 400 mg by mouth 2 (two) times daily.        Marland Kitchen ALPRAZolam (XANAX) 0.25 MG tablet Take 0.25 mg by mouth 2 (two) times daily as needed.       Marland Kitchen buPROPion (WELLBUTRIN XL) 150 MG 24 hr tablet Take 450 mg by mouth daily. Pt takes 3 tabs 450 mg dose      . clobetasol (TEMOVATE) 0.05 % cream Apply 1 application topically as needed.       Marland Kitchen co-enzyme Q-10 30 MG capsule Take 30 mg by mouth daily.       . diazepam (VALIUM) 5 MG tablet Take 2.5 mg by mouth at bedtime.       . DULoxetine (CYMBALTA) 60 MG capsule Take 60 mg by mouth daily.        . fluconazole (DIFLUCAN) 150 MG tablet Take 150 mg by mouth daily as needed.        Marland Kitchen levothyroxine (SYNTHROID, LEVOTHROID) 75 MCG tablet Take 75 mcg by mouth daily.        Marland Kitchen  meloxicam (MOBIC) 7.5 MG tablet Take 7.5 mg by mouth 2 (two) times daily.       . methocarbamol (ROBAXIN) 500 MG tablet Take 500 mg by mouth 4 (four) times daily.       . methylphenidate (RITALIN) 20 MG tablet Take 20 mg by mouth 3 (three) times daily.       Marland Kitchen oxycodone (OXY-IR) 5 MG capsule Take 5 mg by mouth every 4 (four) hours as needed.      Marland Kitchen oxyCODONE (OXYCONTIN) 10 MG 12 hr tablet Take 10 mg by mouth every 4 (four) hours as needed. For pain      . oxymorphone (OPANA ER) 20 MG 12 hr tablet Take 40 mg by mouth every 12 (twelve) hours.        . pentosan polysulfate (ELMIRON) 100 MG capsule Take 200 mg by mouth 2 (two) times daily.        . phenazopyridine (PYRIDIUM) 100 MG tablet Take 100 mg by mouth 3 (three) times daily as needed. For UTI pain      . potassium chloride (KLOR-CON) 10 MEQ CR tablet Take by mouth. Patient takes up to 70-80 meq daily      . RABEprazole (ACIPHEX) 20 MG tablet Take 20 mg by mouth 2 (two) times daily.       Marland Kitchen thiamine 100 MG tablet Take 100 mg by mouth daily.        Marland Kitchen topiramate (TOPAMAX) 50 MG tablet Take 50 mg by mouth 3 (three)  times daily.       Marland Kitchen triamcinolone (NASACORT AQ) 55 MCG/ACT nasal inhaler Place 2 sprays into the nose daily as needed.        . triamterene-hydrochlorothiazide (MAXZIDE) 75-50 MG per tablet Take 1 tablet by mouth daily.        . valsartan-hydrochlorothiazide (DIOVAN-HCT) 160-12.5 MG per tablet Take 1 tablet by mouth daily.        . vitamin E 400 UNIT capsule Take 400 Units by mouth daily.        Marland Kitchen zolpidem (AMBIEN) 5 MG tablet Take 2.5 mg by mouth at bedtime as needed. For sleep      . cetirizine (ZYRTEC) 10 MG tablet Take 10 mg by mouth daily.        . calcium citrate-vitamin D (CITRACAL+D) 315-200 MG-UNIT per tablet Take 2 tablets by mouth 2 (two) times daily.        . nitrofurantoin, macrocrystal-monohydrate, (MACROBID) 100 MG capsule Take 100 mg by mouth 2 (two) times daily. Take for 7 days       No facility-administered medications prior to visit.     Review of Systems  Constitutional:   No  weight loss, night sweats,  Fevers, chills, fatigue, lassitude. HEENT:   No headaches,  Difficulty swallowing,  Tooth/dental problems,  Sore throat,                No sneezing, itching, ear ache, nasal congestion, post nasal drip,   CV:  No chest pain,  Orthopnea, PND, swelling in lower extremities, anasarca, dizziness, palpitations  GI  No heartburn, indigestion, abdominal pain, nausea, vomiting, diarrhea, change in bowel habits, loss of appetite  Resp: No shortness of breath with exertion or at rest.  No excess mucus, no productive cough,  Notes  non-productive cough,  No coughing up of blood.  No change in color of mucus.  No wheezing.  No chest wall deformity  Skin: no rash or lesions.  GU: no dysuria, change in  color of urine, no urgency or frequency.  No flank pain.  MS:  No joint pain or swelling.  No decreased range of motion.  No back pain.  Psych:  No change in mood or affect. No depression or anxiety.  No memory loss.       Objective:   Physical Exam BP 118/64  Pulse 105   Temp(Src) 98.1 F (36.7 C) (Oral)  Ht 4' 10.5" (1.486 m)  Wt 183 lb 8 oz (83.235 kg)  BMI 37.70 kg/m2  SpO2 95%  Gen: Chronically ill,  in no distress, depressed affect  ENT: No lesions,  mouth clear,  oropharynx clear, no postnasal drip  Neck: No JVD, no TMG, no carotid bruits  Lungs: No use of accessory muscles, no dullness to percussion, clear without rales or rhonchi, pseudowheeze  Cardiovascular: RRR, heart sounds normal, no murmur or gallops, no peripheral edema  Musculoskeletal: No deformities, no cyanosis or clubbing  Neuro: alert, non focal  Skin: Warm, no lesions or rashes     Assessment & Plan:  Chronic cough 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 pharmacy A depomedrol 120mg  Injection was given Stay on saline rinse and nasacort Use cough syrup as needed or mucinex DM for cough control Return 1 month

## 2011-10-11 NOTE — Patient Instructions (Signed)
Take augmentin one twice daily for 10days, sent to [facility] pharmacy A depomedrol 120mg  Injection was given Stay on saline rinse and nasacort Return to Dr Delton Coombes to recheck in 1 month Use cough syrup as needed or mucinex DM for cough control

## 2011-10-11 NOTE — Assessment & Plan Note (Signed)
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

## 2011-10-12 ENCOUNTER — Telehealth: Payer: Self-pay | Admitting: Critical Care Medicine

## 2011-10-12 NOTE — Telephone Encounter (Signed)
I spoke with pt and is aware of PW response. She voiced her understanding and had no questions

## 2011-10-12 NOTE — Telephone Encounter (Signed)
These are very unusual reactions to a depomedrol injection

## 2011-10-12 NOTE — Telephone Encounter (Signed)
I spoke with pt and she stated she had received a depo medrol injection yesterday and then starting at midnight she was vomiting, had nausea, chills, sweats, diarrhea. She stated the site where the injection was given was not red, swollen, tender to touch. Pt states she has not had any of those symptoms since this morning but is wanting to know if it was the depo shot that made her do this. Please advise Dr. Delford Field, thanks

## 2011-11-12 ENCOUNTER — Ambulatory Visit (INDEPENDENT_AMBULATORY_CARE_PROVIDER_SITE_OTHER): Payer: Managed Care, Other (non HMO) | Admitting: Emergency Medicine

## 2011-11-12 ENCOUNTER — Encounter: Payer: Self-pay | Admitting: Emergency Medicine

## 2011-11-12 VITALS — BP 132/82 | HR 105 | Temp 98.7°F | Ht 58.5 in | Wt 181.8 lb

## 2011-11-12 DIAGNOSIS — R053 Chronic cough: Secondary | ICD-10-CM

## 2011-11-12 DIAGNOSIS — R05 Cough: Secondary | ICD-10-CM

## 2011-11-12 DIAGNOSIS — R059 Cough, unspecified: Secondary | ICD-10-CM

## 2011-11-12 NOTE — Patient Instructions (Signed)
Please continue your Zyrtec and mucinex Start nasonex 2 sprays each side daily If your cough returns then start nasal saline washes daiily Use Ventolin 2 puffs if needed for cough or shortness of breath Follow with Dr Delton Coombes in 6 months or sooner if you have any problems

## 2011-11-12 NOTE — Progress Notes (Signed)
Subjective:    Patient ID: Kristina Horton, female    DOB: 07-03-1946, 66 y.o.   MRN: [id number]  HPI  66 y.o. woman, former smoker (normal PFT 06/2008), IBS, fibromyalgia (Deveshwar), allergic rhinitis, OSA (doesn't use her PPV, PSG 05/08/08), HTN.  She is referred for chronic cough. She tells me that she started coughing in January 2012, bothers her when she is warm, when she exerts or goes outside. She believes that there is an allergy component - can't take allergy shots. She is on zyrtec, nasocort. Has been treated with abx (doxy and avelox), no steroids. She has had CXR, no CT scan chest.  She has GERD, seems to be managed on Aciphex bid.   10/11/2011  RB office patient not seen since 10/12 Chronic cough, worse if windy and allergy flare.  Had allergy tests in the past Did have fever 1 week ago, took doxy and fever went away.  Still brings up mucus.  Mucus is yellow.  Pt is still dyspneic with exertion  ROV 11/12/11 -- 66 yo woman, former smoker (normal PFT 06/2008), IBS, fibromyalgia (Deveshwar), allergic rhinitis, OSA (doesn't use her PPV, PSG 05/08/08), HTN.  Seen by Dr Delford Field for a flare of her chronic cough. Was rx with augmentin + steroids.  She is taking zyrtec, misplaced her nasocort. She stopped NSW's. Remains Aciphex.    Past Medical History  Diagnosis Date  . Hypertension   . Cancer   . IBS (irritable bowel syndrome)   . IC (interstitial cystitis)   . Allergic rhinitis   . Fibromyalgia   . Osteoarthritis      Family History  Problem Relation Age of Onset  . COPD Mother   . Heart failure Mother   . Heart failure Father   . Alcohol abuse Mother   . Alcohol abuse Brother      History   Social History  . Marital Status: Married    Spouse Name: N/A    Number of Children: N/A  . Years of Education: N/A   Occupational History  . Not on file.   Social History Main Topics  . Smoking status: Former Smoker -- 3.0 packs/day for 18 years    Types: Cigarettes   Quit date: 07/12/1978  . Smokeless tobacco: Never Used  . Alcohol Use: No  . Drug Use: No  . Sexually Active: Not on file   Other Topics Concern  . Not on file   Social History Narrative  . No narrative on file     Allergies  Allergen Reactions  . Demerol   . Iodine   . Shellfish-Derived Products   . Sulfa Drugs Cross Reactors      Outpatient Prescriptions Prior to Visit  Medication Sig Dispense Refill  . acyclovir (ZOVIRAX) 400 MG tablet Take 400 mg by mouth 2 (two) times daily.        Marland Kitchen ALPRAZolam (XANAX) 0.25 MG tablet Take 0.25 mg by mouth 2 (two) times daily as needed.       Marland Kitchen buPROPion (WELLBUTRIN XL) 150 MG 24 hr tablet Take 450 mg by mouth daily. Pt takes 3 tabs 450 mg dose      . clobetasol (TEMOVATE) 0.05 % cream Apply 1 application topically as needed.       Marland Kitchen co-enzyme Q-10 30 MG capsule Take 30 mg by mouth daily.       Marland Kitchen Dextromethorphan-Guaifenesin (MUCINEX DM MAXIMUM STRENGTH) 60-1200 MG per 12 hr tablet Take 1 tablet by mouth every 12 (twelve)  hours.      . diazepam (VALIUM) 5 MG tablet Take 2.5 mg by mouth at bedtime.       . DULoxetine (CYMBALTA) 60 MG capsule Take 60 mg by mouth daily.        . fluconazole (DIFLUCAN) 150 MG tablet Take 150 mg by mouth daily as needed.        Marland Kitchen HYDROcodone-homatropine (HYDROMET) 5-1.5 MG/5ML syrup Take by mouth. 1 tsp every 4 hours as needed      . levalbuterol (XOPENEX HFA) 45 MCG/ACT inhaler Inhale 2 puffs into the lungs 3 (three) times daily.      Marland Kitchen levothyroxine (SYNTHROID, LEVOTHROID) 75 MCG tablet Take 75 mcg by mouth daily.        . meloxicam (MOBIC) 7.5 MG tablet Take 7.5 mg by mouth 2 (two) times daily.       . methocarbamol (ROBAXIN) 500 MG tablet Take 500 mg by mouth 4 (four) times daily.       . methylphenidate (RITALIN) 20 MG tablet Take 20 mg by mouth 3 (three) times daily.       Marland Kitchen oxycodone (OXY-IR) 5 MG capsule Take 5 mg by mouth every 4 (four) hours as needed.      Marland Kitchen oxymetazoline (AFRIN) 0.05 % nasal spray  Place 2 sprays into the nose as needed.      Marland Kitchen oxymorphone (OPANA ER) 20 MG 12 hr tablet Take 40 mg by mouth every 12 (twelve) hours.        . pentosan polysulfate (ELMIRON) 100 MG capsule Take 200 mg by mouth 2 (two) times daily.        . phenazopyridine (PYRIDIUM) 100 MG tablet Take 100 mg by mouth 3 (three) times daily as needed. For UTI pain      . potassium chloride (KLOR-CON) 10 MEQ CR tablet Take by mouth. Patient takes up to 70-80 meq daily      . RABEprazole (ACIPHEX) 20 MG tablet Take 20 mg by mouth 2 (two) times daily.       . sodium chloride (OCEAN) 0.65 % nasal spray Place 1 spray into the nose as needed.      . thiamine 100 MG tablet Take 100 mg by mouth daily.        Marland Kitchen topiramate (TOPAMAX) 50 MG tablet Take 50 mg by mouth 3 (three) times daily.       Marland Kitchen triamcinolone (NASACORT AQ) 55 MCG/ACT nasal inhaler Place 2 sprays into the nose daily.       Marland Kitchen triamterene-hydrochlorothiazide (MAXZIDE) 75-50 MG per tablet Take 1 tablet by mouth daily.        . valsartan-hydrochlorothiazide (DIOVAN-HCT) 160-12.5 MG per tablet Take 1 tablet by mouth daily.        . vitamin E 400 UNIT capsule Take 400 Units by mouth daily.        Marland Kitchen zolpidem (AMBIEN) 5 MG tablet Take 2.5 mg by mouth at bedtime as needed. For sleep      . cetirizine (ZYRTEC) 10 MG tablet HOLD   For 7days      . oxyCODONE (OXYCONTIN) 10 MG 12 hr tablet Take 10 mg by mouth every 4 (four) hours as needed. For pain            Objective:   Physical Exam BP 132/82  Pulse 105  Temp(Src) 98.7 F (37.1 C) (Oral)  Ht 4' 10.5" (1.486 m)  Wt 181 lb 12.8 oz (82.464 kg)  BMI 37.35 kg/m2  SpO2 95%  Gen: Chronically ill,  in no distress, depressed affect  ENT: No lesions,  mouth clear,  oropharynx clear, no postnasal drip  Neck: No JVD, no TMG, no carotid bruits  Lungs: No use of accessory muscles, no dullness to percussion, clear without rales or rhonchi, pseudowheeze  Cardiovascular: RRR, heart sounds normal, no murmur or  gallops, no peripheral edema  Musculoskeletal: No deformities, no cyanosis or clubbing  Neuro: alert, non focal  Skin: Warm, no lesions or rashes     Assessment & Plan:  Chronic cough Improved after flare.  - need to rx her allergies more aggressively - continue aciphex - rov 6 months

## 2011-11-12 NOTE — Assessment & Plan Note (Signed)
Improved after flare.  - need to rx her allergies more aggressively - continue aciphex - rov 6 months

## 2011-11-30 ENCOUNTER — Telehealth: Payer: Self-pay

## 2011-11-30 NOTE — Telephone Encounter (Signed)
Error

## 2011-11-30 NOTE — Telephone Encounter (Signed)
error 

## 2012-02-15 ENCOUNTER — Encounter (HOSPITAL_COMMUNITY): Payer: Self-pay | Admitting: Oncology

## 2012-02-15 ENCOUNTER — Ambulatory Visit (HOSPITAL_COMMUNITY): Payer: Medicare Other | Admitting: Oncology

## 2012-02-15 NOTE — Progress Notes (Signed)
This encounter was created in error - please disregard.

## 2012-02-16 ENCOUNTER — Other Ambulatory Visit (HOSPITAL_COMMUNITY): Payer: Self-pay | Admitting: *Deleted

## 2012-02-17 ENCOUNTER — Ambulatory Visit (HOSPITAL_COMMUNITY): Payer: Managed Care, Other (non HMO) | Admitting: Oncology

## 2012-04-20 ENCOUNTER — Other Ambulatory Visit (HOSPITAL_COMMUNITY): Payer: Self-pay | Admitting: Neurosurgery

## 2012-04-20 DIAGNOSIS — R209 Unspecified disturbances of skin sensation: Secondary | ICD-10-CM

## 2012-04-25 ENCOUNTER — Ambulatory Visit (HOSPITAL_COMMUNITY): Payer: Managed Care, Other (non HMO)

## 2012-04-26 ENCOUNTER — Ambulatory Visit (HOSPITAL_COMMUNITY): Payer: Managed Care, Other (non HMO)

## 2012-05-12 ENCOUNTER — Ambulatory Visit (INDEPENDENT_AMBULATORY_CARE_PROVIDER_SITE_OTHER): Payer: Managed Care, Other (non HMO) | Admitting: Emergency Medicine

## 2012-05-12 ENCOUNTER — Encounter: Payer: Self-pay | Admitting: Emergency Medicine

## 2012-05-12 VITALS — HR 103 | Temp 98.1°F | Ht <= 58 in | Wt 179.6 lb

## 2012-05-12 DIAGNOSIS — R059 Cough, unspecified: Secondary | ICD-10-CM

## 2012-05-12 DIAGNOSIS — R05 Cough: Secondary | ICD-10-CM

## 2012-05-12 DIAGNOSIS — R053 Chronic cough: Secondary | ICD-10-CM

## 2012-05-12 MED ORDER — TRIAMCINOLONE ACETONIDE(NASAL) 55 MCG/ACT NA INHA: 2.0000 | Freq: Two times a day (BID) | NASAL | Status: DC

## 2012-05-12 NOTE — Patient Instructions (Addendum)
Continue your aciphex twice a day Continue your nasonex and zyrtec Consider restarting your nasal saline washes daily Try using decongestants that contain either chlorpheniramine or brompheniramine as directed (over-the-counter) Follow with Dr Delton Coombes in 6 months or sooner if you have any problems

## 2012-05-12 NOTE — Progress Notes (Signed)
  Subjective:    Patient ID: Kristina Horton, female    DOB: 1946/05/18, 66 y.o.   MRN: [id number]  HPI  66 y.o. woman, former smoker (normal PFT 06/2008), IBS, fibromyalgia (Deveshwar), allergic rhinitis, OSA (doesn't use her PPV, PSG 05/08/08), HTN.  She is referred for chronic cough. She tells me that she started coughing in January 2012, bothers her when she is warm, when she exerts or goes outside. She believes that there is an allergy component - can't take allergy shots. She is on zyrtec, nasocort. Has been treated with abx (doxy and avelox), no steroids. She has had CXR, no CT scan chest.  She has GERD, seems to be managed on Aciphex bid.   10/11/2011  RB office patient not seen since 10/12 Chronic cough, worse if windy and allergy flare.  Had allergy tests in the past Did have fever 1 week ago, took doxy and fever went away.  Still brings up mucus.  Mucus is yellow.  Pt is still dyspneic with exertion  ROV 11/12/11 -- 66 yo woman, former smoker (normal PFT 06/2008), IBS, fibromyalgia (Deveshwar), allergic rhinitis, OSA (doesn't use her PPV, PSG 05/08/08), HTN.  Seen by Dr Delford Field for a flare of her chronic cough. Was rx with augmentin + steroids.  She is taking zyrtec, misplaced her nasocort. She stopped NSW's. Remains Aciphex.   ROV 05/12/12 -- allergic rhinitis, OSA, HTN, allergies, GERD, cough. She is on nasonex, zyrtec. Feels some mucous back of her throat. Not doing NSW right now. Having R ear fullness and some pain like it needs to "pop". Still on aciphex bid.      Objective:   Physical Exam Pulse 103  Temp 98.1 F (36.7 C) (Oral)  Ht 4\' 10"  (1.473 m)  Wt 179 lb 9.6 oz (81.466 kg)  BMI 37.54 kg/m2  SpO2 93%  Gen: Chronically ill,  in no distress, depressed affect  ENT: No lesions,  mouth clear,  oropharynx clear, some R eardrum fullness, no erythema, no purulence, L ear is clear  Neck: No JVD, no TMG, no carotid bruits  Lungs: No use of accessory muscles, no dullness to  percussion, clear without rales or rhonchi, pseudowheeze  Cardiovascular: RRR, heart sounds normal, no murmur or gallops, no peripheral edema  Musculoskeletal: No deformities, no cyanosis or clubbing  Neuro: alert, non focal  Skin: Warm, no lesions or rashes     Assessment & Plan:  Chronic cough Continue your aciphex twice a day Continue your nasonex and zyrtec Consider restarting your nasal saline washes daily Try using decongestants that contain either chlorpheniramine or brompheniramine as directed (over-the-counter) Follow with Dr Delton Coombes in 6 months or sooner if you have any problems

## 2012-05-15 NOTE — Assessment & Plan Note (Signed)
Continue your aciphex twice a day Continue your nasonex and zyrtec Consider restarting your nasal saline washes daily Try using decongestants that contain either chlorpheniramine or brompheniramine as directed (over-the-counter) Follow with Dr Madalyne Husk in 6 months or sooner if you have any problems   

## 2012-05-18 ENCOUNTER — Encounter (HOSPITAL_COMMUNITY): Payer: Managed Care, Other (non HMO) | Attending: Oncology

## 2012-05-18 ENCOUNTER — Other Ambulatory Visit (HOSPITAL_COMMUNITY): Payer: Managed Care, Other (non HMO)

## 2012-05-18 DIAGNOSIS — M1990 Unspecified osteoarthritis, unspecified site: Secondary | ICD-10-CM | POA: Insufficient documentation

## 2012-05-18 DIAGNOSIS — IMO0001 Reserved for inherently not codable concepts without codable children: Secondary | ICD-10-CM | POA: Insufficient documentation

## 2012-05-18 DIAGNOSIS — N3090 Cystitis, unspecified without hematuria: Secondary | ICD-10-CM | POA: Insufficient documentation

## 2012-05-18 DIAGNOSIS — D509 Iron deficiency anemia, unspecified: Secondary | ICD-10-CM

## 2012-05-18 DIAGNOSIS — K5900 Constipation, unspecified: Secondary | ICD-10-CM | POA: Insufficient documentation

## 2012-05-18 DIAGNOSIS — K589 Irritable bowel syndrome without diarrhea: Secondary | ICD-10-CM | POA: Insufficient documentation

## 2012-05-18 DIAGNOSIS — I10 Essential (primary) hypertension: Secondary | ICD-10-CM | POA: Insufficient documentation

## 2012-05-18 DIAGNOSIS — D649 Anemia, unspecified: Secondary | ICD-10-CM

## 2012-05-18 LAB — CBC WITH DIFFERENTIAL/PLATELET
Basophils Absolute: 0 10*3/uL (ref 0.0–0.1)
Eosinophils Relative: 2 % (ref 0–5)
HCT: 42.4 % (ref 36.0–46.0)
Hemoglobin: 14.2 g/dL (ref 12.0–15.0)
Lymphocytes Relative: 16 % (ref 12–46)
Lymphs Abs: 1.4 10*3/uL (ref 0.7–4.0)
MCV: 92.6 fL (ref 78.0–100.0)
Monocytes Absolute: 0.9 10*3/uL (ref 0.1–1.0)
Monocytes Relative: 10 % (ref 3–12)
Neutro Abs: 6.5 10*3/uL (ref 1.7–7.7)
RBC: 4.58 MIL/uL (ref 3.87–5.11)
RDW: 13.3 % (ref 11.5–15.5)
WBC: 8.9 10*3/uL (ref 4.0–10.5)

## 2012-05-18 NOTE — Progress Notes (Signed)
Kristina Horton presented for labwork. Labs per MD order drawn via Peripheral Line 23 gauge needle inserted in lt ac Good blood return present. Procedure without incident.  Needle removed intact. Patient tolerated procedure well.

## 2012-05-25 ENCOUNTER — Encounter (HOSPITAL_COMMUNITY): Payer: Self-pay | Admitting: Pharmacy Technician

## 2012-05-25 ENCOUNTER — Encounter (HOSPITAL_BASED_OUTPATIENT_CLINIC_OR_DEPARTMENT_OTHER): Payer: Managed Care, Other (non HMO) | Admitting: Oncology

## 2012-05-25 ENCOUNTER — Encounter (HOSPITAL_COMMUNITY): Payer: Self-pay | Admitting: Oncology

## 2012-05-25 VITALS — BP 121/75 | HR 106 | Temp 97.2°F | Resp 20 | Wt 183.3 lb

## 2012-05-25 DIAGNOSIS — R911 Solitary pulmonary nodule: Secondary | ICD-10-CM

## 2012-05-25 DIAGNOSIS — D509 Iron deficiency anemia, unspecified: Secondary | ICD-10-CM

## 2012-05-25 DIAGNOSIS — M1990 Unspecified osteoarthritis, unspecified site: Secondary | ICD-10-CM

## 2012-05-25 DIAGNOSIS — I10 Essential (primary) hypertension: Secondary | ICD-10-CM

## 2012-05-25 HISTORY — DX: Iron deficiency anemia, unspecified: D50.9

## 2012-05-25 NOTE — Progress Notes (Signed)
Fredirick Maudlin, MD [address] [place] [address]  1. Iron deficiency anemia     CURRENT THERAPY: Observation  INTERVAL HISTORY: Kristina Horton 66 y.o. female returns for  regular  visit for followup of Iron deficiency anemia presenting as a microcytic process status post IV Feraheme infusion on 07/17/2010 and 07/24/2010 with an excellent response.  I personally reviewed and went over laboratory results with the patient.  I printed out copies of her lab work for her to have.  We discussed her decreasing ferritin which is slowly decreasing.  I suspect that she will require Feraheme IV next year, probably in Feb 2014 at the rate in which she is decreasing.  Her MCV is WNL along with her RDW.  Her Hgb is also within normal limits.  I personally reviewed and went over radiographic studies with the patient.  She had a CT scan performed at [facility] recently which revealed a large stool burden in addition to a pulmonary nodule.  This imaging report is being scanned into [facility].  The patient admits that she used to smoke tobacco and therefore we recommend a CT chest follow-up in 6 months.  She reports that she will take that information back to her PCP.   She is undergoing a colonoscopy in the near future.    Complete ROS questioning is negative.   Past Medical History  Diagnosis Date  . Hypertension   . Cancer   . IBS (irritable bowel syndrome)   . IC (interstitial cystitis)   . Allergic rhinitis   . Fibromyalgia   . Osteoarthritis   . Iron deficiency anemia 05/25/2012    Requiring IV Feraheme     has Chronic cough; Fibromyalgia; IBS (irritable bowel syndrome); IC (interstitial cystitis); Hypertension; Allergic rhinitis; Osteoarthritis; and Iron deficiency anemia on her problem list.     is allergic to demerol; fluconazole; iodine; shellfish-derived products; and sulfa drugs cross reactors.  Ms. Cid does not currently have medications on file.  Past Surgical  History  Procedure Date  . Spinal fusion 2010    C2-T2, done in [place]  . Vesicovaginal fistula closure w/ tah 1998  . Appendectomy 1974  . Breast surgery 1962    benign rumor  . Thyroidectomy, partial   . Vesico-vaginal fistula repair 1997    Denies any headaches, dizziness, double vision, fevers, chills, night sweats, nausea, vomiting, diarrhea, chest pain, heart palpitations, shortness of breath, blood in stool, black tarry stool, urinary pain, urinary burning, urinary frequency, hematuria.   PHYSICAL EXAMINATION  ECOG PERFORMANCE STATUS: 2 - Symptomatic, <50% confined to bed  Filed Vitals:   05/25/12 1503  BP: 121/75  Pulse: 106  Temp: 97.2 F (36.2 C)  Resp: 20    GENERAL:alert, no distress, well nourished, well developed, comfortable, cooperative, obese and smiling SKIN: skin color, texture, turgor are normal, no rashes or significant lesions HEAD: Normocephalic, No masses, lesions, tenderness or abnormalities EYES: normal, Conjunctiva are pink and non-injected EARS: External ears normal OROPHARYNX:mucous membranes are moist  NECK: supple, trachea midline LYMPH:  not examined BREAST:not examined LUNGS: clear to auscultation  HEART: regular rate & rhythm, no murmurs, no gallops, S1 normal and S2 normal ABDOMEN: obese and normal bowel sounds BACK: Back symmetric, no curvature. EXTREMITIES:less then 2 second capillary refill, no joint deformities, effusion, or inflammation, no skin discoloration, no cyanosis  NEURO: alert & oriented x 3 with fluent speech, no focal motor/sensory deficits   LABORATORY DATA: CBC    Component  Value Date/Time   WBC 8.9 05/18/2012 1258   RBC 4.58 05/18/2012 1258   HGB 14.2 05/18/2012 1258   HCT 42.4 05/18/2012 1258   PLT 314 05/18/2012 1258   MCV 92.6 05/18/2012 1258   MCH 31.0 05/18/2012 1258   MCHC 33.5 05/18/2012 1258   RDW 13.3 05/18/2012 1258   LYMPHSABS 1.4 05/18/2012 1258   MONOABS 0.9 05/18/2012 1258   EOSABS 0.2 05/18/2012  1258   BASOSABS 0.0 05/18/2012 1258      Chemistry      Component Value Date/Time   NA 134* 03/07/2009 0530   K 4.6 RESULT REPEATED AND VERIFIED DELTA CHECK NOTED NO VISIBLE HEMOLYSIS 03/07/2009 0530   CL 106 03/07/2009 0530   CO2 24 03/07/2009 0530   BUN 4* 03/07/2009 0530   CREATININE 0.80 03/07/2009 0530      Component Value Date/Time   CALCIUM 8.3* 03/07/2009 0530     Lab Results  Component Value Date   FERRITIN 86 05/18/2012   Results for Kristina Horton, Kristina Horton (MRN [id number]) as of 05/25/2012 15:10  Ref. Range 08/21/2010 12:57 06/29/2011 13:04 09/20/2011 12:22 05/18/2012 12:58  Ferritin Latest Range: 10-291 ng/mL 734 (H) 168 123 86    RADIOGRAPHIC STUDIES:  CT scan at [facility] this month (November 2013) revealed a large stool burden in addition to pulmonary nodule.  The report is not available presently and is being scanned into [facility]   ASSESSMENT:  1. Iron deficiency anemia, replenished with IV Feraheme in 2012.  Slowly decreasing.  2. Fibromyalgia 3. HTN 4. IBS 5. Interstitial cystitis 6. Osteoarthritis 7. Constipation   PLAN:  1. I personally reviewed and went over laboratory results with the patient. 2. I personally reviewed and went over radiographic studies with the patient. 3. Lab work in 3, 6, and 9 months: CBC, Ferritin 4. Recommend CT of chest without contrast in 6 months to evaluate stability of pulmonary nodule. She will have this done by PCP. 5. Recommended increased fiber or fiber supplement to diet. 6. Anticipate Feraheme IV 1020 in Feb 2014 after next lab appointment. Will order according to lab work.  Will order when Ferritin encloses on 60. 7. Return in 9 months for follow-up   All questions were answered. The patient knows to call the clinic with any problems, questions or concerns. We can certainly see the patient much sooner if necessary.  The patient and plan discussed with Glenford Peers, MD and he is in agreement with the  aforementioned.  Stanislaus Kaltenbach

## 2012-05-25 NOTE — Patient Instructions (Addendum)
[facility]  Discharge Instructions  RECOMMENDATIONS MADE BY THE CONSULTANT AND ANY TEST RESULTS WILL BE SENT TO YOUR REFERRING DOCTOR.   EXAM FINDINGS BY MD TODAY AND SIGNS AND SYMPTOMS TO REPORT TO CLINIC OR PRIMARY MD:  Exam per T. Jacalyn Lefevre PA  We will do labs in 3 months, 6 months, and 9 months  You will see Dr. Mariel Sleet after the 9 months lab. INSTRUCTIONS GIVEN AND DISCUSSED: We recommend a CT scan in 6 months to f/u with pulmonary nodule. Patient has requested to f/u with this with [facility].  We hope your colonoscopy goes well.  I acknowledge that I have been informed and understand all the instructions given to me and received a copy. I do not have any more questions at this time, but understand that I may call the Specialty Clinic at [facility] at [phone] during business hours should I have any further questions or need assistance in obtaining follow-up care.    __________________________________________  _____________  __________ Signature of Patient or Authorized Representative            Date                   Time    __________________________________________ Nurse's Signature

## 2012-05-29 ENCOUNTER — Ambulatory Visit (HOSPITAL_COMMUNITY)
Admission: RE | Admit: 2012-05-29 | Discharge: 2012-05-29 | Disposition: A | Discharge status: Home / Self Care | Payer: Managed Care, Other (non HMO) | Source: Ambulatory Visit | Attending: Gastroenterology | Admitting: Gastroenterology

## 2012-05-29 ENCOUNTER — Encounter (HOSPITAL_COMMUNITY)
Admission: RE | Disposition: A | Discharge status: Home / Self Care | Payer: Self-pay | Source: Ambulatory Visit | Attending: Gastroenterology

## 2012-05-29 ENCOUNTER — Ambulatory Visit (HOSPITAL_COMMUNITY): Payer: Managed Care, Other (non HMO) | Admitting: *Deleted

## 2012-05-29 ENCOUNTER — Other Ambulatory Visit: Payer: Self-pay | Admitting: Gastroenterology

## 2012-05-29 ENCOUNTER — Encounter (HOSPITAL_COMMUNITY): Payer: Self-pay | Admitting: *Deleted

## 2012-05-29 DIAGNOSIS — K2960 Other gastritis without bleeding: Secondary | ICD-10-CM | POA: Insufficient documentation

## 2012-05-29 DIAGNOSIS — K649 Unspecified hemorrhoids: Secondary | ICD-10-CM | POA: Insufficient documentation

## 2012-05-29 DIAGNOSIS — K449 Diaphragmatic hernia without obstruction or gangrene: Secondary | ICD-10-CM | POA: Insufficient documentation

## 2012-05-29 DIAGNOSIS — R933 Abnormal findings on diagnostic imaging of other parts of digestive tract: Secondary | ICD-10-CM | POA: Insufficient documentation

## 2012-05-29 DIAGNOSIS — R109 Unspecified abdominal pain: Secondary | ICD-10-CM | POA: Insufficient documentation

## 2012-05-29 HISTORY — PX: COLONOSCOPY WITH PROPOFOL: SHX5780

## 2012-05-29 HISTORY — PX: ESOPHAGOGASTRODUODENOSCOPY (EGD) WITH PROPOFOL: SHX5813

## 2012-05-29 SURGERY — COLONOSCOPY WITH PROPOFOL
Anesthesia: Monitor Anesthesia Care

## 2012-05-29 MED ORDER — LACTATED RINGERS IV SOLN
INTRAVENOUS | Status: DC
  Administered 2012-05-29: 1000 mL via INTRAVENOUS

## 2012-05-29 MED ORDER — KETAMINE HCL 10 MG/ML IJ SOLN
INTRAMUSCULAR | Status: DC | PRN
  Administered 2012-05-29: 5 mg via INTRAVENOUS
  Administered 2012-05-29: 10 mg via INTRAVENOUS
  Administered 2012-05-29: 5 mg via INTRAVENOUS
  Administered 2012-05-29: 100 mg via INTRAVENOUS

## 2012-05-29 MED ORDER — SODIUM CHLORIDE 0.9 % IV SOLN
INTRAVENOUS | Status: DC | PRN
  Administered 2012-05-29: 14:00:00 via INTRAVENOUS

## 2012-05-29 MED ORDER — LACTATED RINGERS IV SOLN
INTRAVENOUS | Status: DC
  Administered 2012-05-29: 12:00:00 via INTRAVENOUS

## 2012-05-29 MED ORDER — ONDANSETRON HCL 4 MG/2ML IJ SOLN
INTRAMUSCULAR | Status: DC | PRN
  Administered 2012-05-29: 4 mg via INTRAVENOUS

## 2012-05-29 MED ORDER — SODIUM CHLORIDE 0.9 % IV SOLN: INTRAVENOUS | Status: DC

## 2012-05-29 MED ORDER — SUCCINYLCHOLINE CHLORIDE 20 MG/ML IJ SOLN
INTRAMUSCULAR | Status: DC | PRN
  Administered 2012-05-29: 100 mg via INTRAVENOUS

## 2012-05-29 MED ORDER — METOCLOPRAMIDE HCL 5 MG/ML IJ SOLN
INTRAMUSCULAR | Status: DC | PRN
  Administered 2012-05-29: 10 mg via INTRAVENOUS

## 2012-05-29 MED ORDER — PROPOFOL 10 MG/ML IV BOLUS
INTRAVENOUS | Status: DC | PRN
  Administered 2012-05-29: 200 mg via INTRAVENOUS

## 2012-05-29 MED ORDER — MIDAZOLAM HCL 5 MG/5ML IJ SOLN
INTRAMUSCULAR | Status: DC | PRN
  Administered 2012-05-29 (×2): 2 mg via INTRAVENOUS

## 2012-05-29 MED ORDER — PROMETHAZINE HCL 25 MG/ML IJ SOLN: 6.2500 mg | INTRAMUSCULAR | Status: DC | PRN

## 2012-05-29 MED ORDER — DEXAMETHASONE SODIUM PHOSPHATE 4 MG/ML IJ SOLN
INTRAMUSCULAR | Status: DC | PRN
  Administered 2012-05-29: 10 mg via INTRAVENOUS

## 2012-05-29 SURGICAL SUPPLY — 25 items

## 2012-05-29 NOTE — Op Note (Signed)
[facility] [address] [place], [address]   COLONOSCOPY PROCEDURE REPORT  PATIENT: Kristina Horton, Kristina Horton  MR#: [id number] BIRTHDATE: [birth year] , 66  yrs. old GENDER: Female ENDOSCOPIST: Vida Rigger, MD REFERRED XB:JYNWGN Juanetta Gosling, M.D. PROCEDURE DATE:  05/29/2012 PROCEDURE:   Colonoscopy, diagnostic ASA CLASS:   Class III INDICATIONS:an abnormal CT. MEDICATIONS: See Anesthesia Report.  DESCRIPTION OF PROCEDURE:   After the risks benefits and alternatives of the procedure were thoroughly explained, informed consent was obtained.  The Pentax Colonoscope [id number]  endoscope was introduced through the anus and advanced to the terminal ileum which was intubated for a short distance , limited by No adverse events experienced. No obvious abnormality was seen on insertion and it did require some abdominal pressure to advance to the cecum and on slow withdrawal from the terminal ileum no abnormalities were seen and once back in the rectum anorectal pull-through and retroflexion revealed some tiny hemorrhoids the scope was straightened and readvanced to shortage at the left side of the colon air was suctioned scope removed  The quality of the prep was adequate. .  The instrument was then slowly withdrawn as the colon was fully examined.Patient tolerated the procedure well there was no obvious immediate complication       FINDINGS:  1 Tiny hemorrhoids 2. Otherwise within normal limits to the terminal ileum COMPLICATIONS: NoneIMPRESSION:  Above  RECOMMENDATIONS: Recheck colon screening in 5 years if doing well medically and proceed with endoscopy and please see that report for other recommendations   _______________________________ eSigned:  Vida Rigger, MD 05/29/2012 2:25 PM   CC:Ed Juanetta Gosling, MD

## 2012-05-29 NOTE — Anesthesia Postprocedure Evaluation (Signed)
  Anesthesia Post-op Note  Patient: Kristina Horton Obst  Procedure(s) Performed: Procedure(s) (LRB): COLONOSCOPY WITH PROPOFOL (N/A) ESOPHAGOGASTRODUODENOSCOPY (EGD) WITH PROPOFOL (N/A)  Patient Location: PACU  Anesthesia Type: General  Level of Consciousness: awake and alert   Airway and Oxygen Therapy: Patient Spontanous Breathing  Post-op Pain: mild  Post-op Assessment: Post-op Vital signs reviewed, Patient's Cardiovascular Status Stable, Respiratory Function Stable, Patent Airway and No signs of Nausea or vomiting  Post-op Vital Signs: stable  Complications: No apparent anesthesia complications

## 2012-05-29 NOTE — Op Note (Signed)
[facility] [address] [place], [address]   ENDOSCOPY PROCEDURE REPORT  PATIENT: Kristina, Horton  MR#: [id number] BIRTHDATE: July 19, 1945 , 66  yrs. old GENDER: Female  ENDOSCOPIST: Vida Rigger, MD REFERRED XB:JYNWGN Juanetta Gosling, M.D.  PROCEDURE DATE:  05/29/2012 PROCEDURE:   EGD, diagnostic ASA CLASS:   Class III INDICATIONS:abdominal pain.  MEDICATIONS: General endotracheal anesthesia (GETA)  TOPICAL ANESTHETIC:None  DESCRIPTION OF PROCEDURE:   After the risks benefits and alternatives of the procedure were thoroughly explained, informed consent was obtained.  The Pentax Gastroscope [id number]  endoscope was introduced through the mouth and advanced to the second portion of the duodenum , limited by Without limitations.Other than a tiny hiatal hernia a few tiny gastric erosions and minimal antritis no endoscopic findings were seen   The instrument was slowly withdrawn as the mucosa was fully examined.Air was suctioned scope removed patient tolerated the procedure well there was no obvious immediate complication         FINDINGS:1. Tiny hiatal hernia 2. Minimal antritis a few tiny gastric erosion 3. Otherwise within normal limits EGD  COMPLICATIONS:None  ENDOSCOPIC IMPRESSION:Above   RECOMMENDATIONS:Proceed with ultrasound and possibly even a CCKHIDA scan and I be happy to see back when necessary and consider surgical consult if either of the above Tests are positive  REPEAT EXAM: As needed   _______________________________ Vida Rigger, MD eSigned:  Vida Rigger, MD 05/29/2012 2:32 PM    CC:Ed Juanetta Gosling, MD  PATIENT NAME:  Kristina, Horton MR#: [id number]

## 2012-05-29 NOTE — Anesthesia Preprocedure Evaluation (Addendum)
Anesthesia Evaluation    Airway Mallampati: II TM Distance: >3 FB Neck ROM: Full    Dental No notable dental hx.    Pulmonary former smoker,  breath sounds clear to auscultation  Pulmonary exam normal       Cardiovascular hypertension, Pt. on medications Rhythm:Regular Rate:Normal     Neuro/Psych    GI/Hepatic   Endo/Other    Renal/GU      Musculoskeletal  (+) Fibromyalgia -  Abdominal   Peds  Hematology   Anesthesia Other Findings   Reproductive/Obstetrics                           Anesthesia Physical Anesthesia Plan  ASA: II  Anesthesia Plan: General   Post-op Pain Management:    Induction: Intravenous  Airway Management Planned: Oral ETT  Additional Equipment:   Intra-op Plan:   Post-operative Plan: Extubation in OR  Informed Consent: I have reviewed the patients History and Physical, chart, labs and discussed the procedure including the risks, benefits and alternatives for the proposed anesthesia with the patient or authorized representative who has indicated his/her understanding and acceptance.   Dental advisory given  Plan Discussed with: CRNA  Anesthesia Plan Comments: (GI requests GA)       Anesthesia Quick Evaluation

## 2012-05-29 NOTE — Transfer of Care (Signed)
Immediate Anesthesia Transfer of Care Note  Patient: Kristina Horton  Procedure(s) Performed: Procedure(s) (LRB) with comments: COLONOSCOPY WITH PROPOFOL (N/A) - needs general ESOPHAGOGASTRODUODENOSCOPY (EGD) WITH PROPOFOL (N/A)  Patient Location: PACU and Endoscopy Unit  Anesthesia Type:General  Level of Consciousness: awake, alert , oriented, patient cooperative and responds to stimulation  Airway & Oxygen Therapy: Patient Spontanous Breathing and Patient connected to face mask oxygen  Post-op Assessment: Report given to PACU RN, Post -op Vital signs reviewed and stable and Patient moving all extremities  Post vital signs: Reviewed, stable  Complications: No apparent anesthesia complications

## 2012-05-29 NOTE — Progress Notes (Signed)
Kristina Horton 1:12 PM  Subjective:   Patient has been having some upper abdominal pain for 2-3 weeks without any other new symptoms or new medicine and her CT scan was reviewed as well as her family history and she has no other new complaints  Objective: Vital signs stable afebrile no acute distress exam okay please see preAssessment evaluation  Assessment: Multiple medical problems almost due for colonic screening with abdominal pain and abnormal CAT scan  Plan: Okay to proceed with colonoscopy and endoscopy today under general anesthesia  Kenden Brandt E

## 2012-05-29 NOTE — Preoperative (Signed)
Beta Blockers   Reason not to administer Beta Blockers:Not Applicable, not on home BB 

## 2012-05-30 ENCOUNTER — Encounter (HOSPITAL_COMMUNITY): Payer: Self-pay | Admitting: Gastroenterology

## 2012-06-07 ENCOUNTER — Other Ambulatory Visit (HOSPITAL_COMMUNITY): Payer: Self-pay | Admitting: Pulmonary Disease

## 2012-06-07 DIAGNOSIS — R109 Unspecified abdominal pain: Secondary | ICD-10-CM

## 2012-06-13 ENCOUNTER — Ambulatory Visit (HOSPITAL_COMMUNITY)
Admission: RE | Admit: 2012-06-13 | Discharge: 2012-06-13 | Disposition: A | Discharge status: Home / Self Care | Payer: Managed Care, Other (non HMO) | Source: Ambulatory Visit | Attending: Pulmonary Disease | Admitting: Pulmonary Disease

## 2012-06-13 ENCOUNTER — Other Ambulatory Visit (HOSPITAL_COMMUNITY): Payer: Managed Care, Other (non HMO)

## 2012-06-13 DIAGNOSIS — R109 Unspecified abdominal pain: Secondary | ICD-10-CM | POA: Insufficient documentation

## 2012-06-13 DIAGNOSIS — R112 Nausea with vomiting, unspecified: Secondary | ICD-10-CM | POA: Insufficient documentation

## 2012-08-25 ENCOUNTER — Other Ambulatory Visit (HOSPITAL_COMMUNITY): Payer: Managed Care, Other (non HMO)

## 2012-09-25 ENCOUNTER — Encounter: Payer: Self-pay | Admitting: Oncology

## 2012-10-02 ENCOUNTER — Ambulatory Visit (HOSPITAL_COMMUNITY)
Admission: RE | Admit: 2012-10-02 | Discharge: 2012-10-02 | Disposition: A | Discharge status: Home / Self Care | Payer: Medicare PPO | Source: Ambulatory Visit | Attending: Pulmonary Disease | Admitting: Pulmonary Disease

## 2012-10-02 ENCOUNTER — Other Ambulatory Visit (HOSPITAL_COMMUNITY): Payer: Self-pay | Admitting: Pulmonary Disease

## 2012-10-02 ENCOUNTER — Ambulatory Visit (HOSPITAL_COMMUNITY): Payer: Medicare PPO

## 2012-10-02 DIAGNOSIS — M79604 Pain in right leg: Secondary | ICD-10-CM

## 2012-10-02 DIAGNOSIS — M79609 Pain in unspecified limb: Secondary | ICD-10-CM | POA: Insufficient documentation

## 2012-10-02 DIAGNOSIS — M25559 Pain in unspecified hip: Secondary | ICD-10-CM | POA: Insufficient documentation

## 2012-10-18 ENCOUNTER — Ambulatory Visit (HOSPITAL_COMMUNITY): Payer: Medicare PPO

## 2012-10-24 ENCOUNTER — Encounter: Payer: Self-pay | Admitting: *Deleted

## 2012-10-24 ENCOUNTER — Encounter: Payer: Self-pay | Admitting: Cardiology

## 2012-10-24 ENCOUNTER — Ambulatory Visit (INDEPENDENT_AMBULATORY_CARE_PROVIDER_SITE_OTHER): Payer: Medicare PPO | Admitting: Cardiology

## 2012-10-24 ENCOUNTER — Encounter (HOSPITAL_COMMUNITY): Payer: Medicare PPO | Attending: Oncology | Admitting: Oncology

## 2012-10-24 VITALS — BP 122/76 | HR 115 | Ht 58.5 in | Wt 168.2 lb

## 2012-10-24 DIAGNOSIS — M169 Osteoarthritis of hip, unspecified: Secondary | ICD-10-CM

## 2012-10-24 DIAGNOSIS — R0600 Dyspnea, unspecified: Secondary | ICD-10-CM

## 2012-10-24 DIAGNOSIS — F329 Major depressive disorder, single episode, unspecified: Secondary | ICD-10-CM

## 2012-10-24 DIAGNOSIS — J449 Chronic obstructive pulmonary disease, unspecified: Secondary | ICD-10-CM

## 2012-10-24 DIAGNOSIS — Z01810 Encounter for preprocedural cardiovascular examination: Secondary | ICD-10-CM

## 2012-10-24 DIAGNOSIS — E876 Hypokalemia: Secondary | ICD-10-CM

## 2012-10-24 DIAGNOSIS — I10 Essential (primary) hypertension: Secondary | ICD-10-CM

## 2012-10-24 DIAGNOSIS — D509 Iron deficiency anemia, unspecified: Secondary | ICD-10-CM | POA: Insufficient documentation

## 2012-10-24 DIAGNOSIS — R9431 Abnormal electrocardiogram [ECG] [EKG]: Secondary | ICD-10-CM

## 2012-10-24 DIAGNOSIS — M1610 Unilateral primary osteoarthritis, unspecified hip: Secondary | ICD-10-CM

## 2012-10-24 DIAGNOSIS — R053 Chronic cough: Secondary | ICD-10-CM

## 2012-10-24 DIAGNOSIS — F3289 Other specified depressive episodes: Secondary | ICD-10-CM

## 2012-10-24 DIAGNOSIS — R0609 Other forms of dyspnea: Secondary | ICD-10-CM

## 2012-10-24 DIAGNOSIS — R05 Cough: Secondary | ICD-10-CM

## 2012-10-24 LAB — CBC WITH DIFFERENTIAL/PLATELET
Basophils Absolute: 0 10*3/uL (ref 0.0–0.1)
Basophils Relative: 0 % (ref 0–1)
Eosinophils Absolute: 0.2 10*3/uL (ref 0.0–0.7)
Eosinophils Relative: 3 % (ref 0–5)
HCT: 39 % (ref 36.0–46.0)
MCH: 30.7 pg (ref 26.0–34.0)
MCHC: 33.3 g/dL (ref 30.0–36.0)
MCV: 92.2 fL (ref 78.0–100.0)
Monocytes Absolute: 0.7 10*3/uL (ref 0.1–1.0)
Platelets: 348 10*3/uL (ref 150–400)
RDW: 13.1 % (ref 11.5–15.5)
WBC: 7.5 10*3/uL (ref 4.0–10.5)

## 2012-10-24 LAB — BASIC METABOLIC PANEL
CO2: 28 mEq/L (ref 19–32)
Calcium: 9.3 mg/dL (ref 8.4–10.5)
Creatinine, Ser: 0.64 mg/dL (ref 0.50–1.10)
GFR calc non Af Amer: >90 mL/min (ref >90)
Sodium: 132 mEq/L — ABNORMAL LOW (ref 135–145)

## 2012-10-24 NOTE — Progress Notes (Signed)
Kristina Horton presented for labwork. Labs per MD order drawn via Peripheral Line 23 gauge needle inserted near left  AC Good blood return present. Procedure without incident.  Needle removed intact. Patient tolerated procedure well.

## 2012-10-24 NOTE — Progress Notes (Signed)
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 Juanetta Gosling. She is to undergo right hip arthroplasty by Dr. Alexis Goodell in [place] on 4/30 under general anesthesia.  She reports no personal history of cardiac disease, cardiomyopathy, or arrhythmia. She states that she underwent pharmacologic stress testing greater than 10 years, and does not recall any abnormalities. She is functionally limited at this time, describes significant difficulty ambulating due to hip pain. She uses a rolling walker. She is not able to climb a flight of steps due to pain. She reports this symptomatology over the last few months, previously states that she was relatively active.  ECG today shows sinus tachycardia with RBBB, LAFB, and LAE. Poor R wave progression throughout the precordium , cannot rule out old infarct pattern. Recent labwork from February shows Hgb 14.3, platelets 300, ESR 14, potassium 3.2, BUN 15, creatinine 0.7, AST 19, ALT 19, TSH 0.8.  She is on a multitude of medications, noted below, and followed by Dr. Juanetta Gosling as well as other specialists. She is also followed by Dr. Delton Coombes from Pulmonary medicine. Records indicate normal PFTs back in 2009, does have chronic cough which may be allergic in etiology. Also pulmonary nodule.  In reviewing her history, she has had no obvious cardiovascular complications with prior surgeries, some of which have been extensive spine surgeries. She has not undergone cardiac structural or ischemic testing for several years.   Allergies  Allergen Reactions  . Demerol Nausea And Vomiting  . Fluconazole     Can take brand name Diflucan not generic  . Iodine Other (See Comments)  . Shellfish-Derived Products Other (See Comments)  . Sulfa Drugs Cross Reactors Nausea And Vomiting    Fever, welts, hives, flu like  . Tape     Current Outpatient Prescriptions  Medication Sig Dispense Refill  .  acyclovir (ZOVIRAX) 400 MG tablet Take 400 mg by mouth 2 (two) times daily.        Marland Kitchen ALPRAZolam (XANAX) 0.25 MG tablet Take 0.25 mg by mouth 2 (two) times daily as needed. For anxiety      . aspirin EC 325 MG tablet Take 325 mg by mouth daily.      . bisacodyl (BISACODYL) 5 MG EC tablet Take 5 mg by mouth daily as needed. For stool softener      . BuPROPion HCl ER, XL, 450 MG TB24 Take 450 mg by mouth every evening.      . cetirizine (ZYRTEC) 10 MG tablet Take 10 mg by mouth at bedtime.       . clobetasol (TEMOVATE) 0.05 % cream Apply 1 application topically daily as needed. In vaginal area      . Coenzyme Q10 (CO Q 10 PO) Take 1 tablet by mouth daily. Strength unknown      . DULoxetine (CYMBALTA) 60 MG capsule Take 60 mg by mouth every evening.       Marland Kitchen EPINEPHrine (EPIPEN 2-PAK) 0.3 mg/0.3 mL DEVI Inject 0.3 mg into the muscle once. To food allergies, delayed reaction      . guaiFENesin (MUCINEX) 600 MG 12 hr tablet Take 600 mg by mouth as needed.       Marland Kitchen HYDROcodone-homatropine (HYCODAN) 5-1.5 MG/5ML syrup Take 5 mLs by mouth every 4 (four) hours as needed. For cough      . levalbuterol (XOPENEX HFA) 45 MCG/ACT inhaler Inhale 2 puffs into the lungs 3 (three) times daily.      Marland Kitchen  levothyroxine (SYNTHROID, LEVOTHROID) 75 MCG tablet Take 75 mcg by mouth every morning.       . lidocaine (XYLOCAINE) 5 % ointment Apply 1 application topically 3 (three) times daily as needed. For itching on bites      . magnesium hydroxide (PHILLIPS CHEWS) 311 MG CHEW Chew 622 mg by mouth at bedtime.      . meloxicam (MOBIC) 7.5 MG tablet Take 15 mg by mouth at bedtime.       . methocarbamol (ROBAXIN) 500 MG tablet Take 500 mg by mouth 4 (four) times daily.       . methylphenidate (RITALIN) 20 MG tablet Take 30 mg by mouth 3 (three) times daily.       . ondansetron (ZOFRAN-ODT) 8 MG disintegrating tablet Take 8 mg by mouth every 6 (six) hours as needed.      Marland Kitchen oxycodone (OXY-IR) 5 MG capsule Take 10 mg by mouth every 4  (four) hours as needed. For break through pain      . oxymorphone (OPANA ER) 20 MG 12 hr tablet Take 40 mg by mouth every 12 (twelve) hours.        . pentosan polysulfate (ELMIRON) 100 MG capsule Take 200 mg by mouth 2 (two) times daily.       . phenazopyridine (PYRIDIUM) 100 MG tablet Take 100 mg by mouth 3 (three) times daily as needed. For UTI pain      . polyethylene glycol (MIRALAX / GLYCOLAX) packet Take 17 g by mouth daily.      . potassium chloride (KLOR-CON) 10 MEQ CR tablet Take 10 mEq by mouth 5 (five) times daily. Patient takes up to 70-80 meq daily      . RABEprazole (ACIPHEX) 20 MG tablet Take 20 mg by mouth 2 (two) times daily.       . sodium chloride (OCEAN) 0.65 % nasal spray Place 1 spray into the nose as needed. For nasal decongestion      . thiamine 100 MG tablet Take 100 mg by mouth daily.        Marland Kitchen topiramate (TOPAMAX) 50 MG tablet Take 50 mg by mouth 3 (three) times daily.       Marland Kitchen triamcinolone (NASACORT AQ) 55 MCG/ACT nasal inhaler Place 2 sprays into the nose 2 (two) times daily.  1 Inhaler  3  . triamterene-hydrochlorothiazide (MAXZIDE) 75-50 MG per tablet Take 1 tablet by mouth every morning.       . valsartan-hydrochlorothiazide (DIOVAN-HCT) 160-12.5 MG per tablet Take 1 tablet by mouth every morning.       . vitamin E 400 UNIT capsule Take 400 Units by mouth daily.        Marland Kitchen zolpidem (AMBIEN) 5 MG tablet Take 2.5 mg by mouth at bedtime as needed. For sleep      . Diclofenac Sodium (PENNSAID) 1.5 % SOLN Place onto the skin.       No current facility-administered medications for this visit.    Past Medical History  Diagnosis Date  . Essential hypertension, benign   . IBS (irritable bowel syndrome)   . IC (interstitial cystitis)   . Allergic rhinitis   . Fibromyalgia   . Osteoarthritis   . Iron deficiency anemia 05/25/2012    Requiring IV Feraheme     Past Surgical History  Procedure Laterality Date  . Spinal fusion  2010    C2-T2, done in [place]  .  Vesicovaginal fistula closure w/ tah  1998  . Appendectomy  1974  .  Breast surgery  1962    benign rumor  . Thyroidectomy, partial    . Vesico-vaginal fistula repair  1997  . Lumbar fusion      2011  . Anterior cervical decomp/discectomy fusion    . Colonoscopy with propofol  05/29/2012    Procedure: COLONOSCOPY WITH PROPOFOL;  Surgeon: Petra Kuba, MD;  Location: [facility];  Service: Endoscopy;  Laterality: N/A;  needs general  . Esophagogastroduodenoscopy (egd) with propofol  05/29/2012    Procedure: ESOPHAGOGASTRODUODENOSCOPY (EGD) WITH PROPOFOL;  Surgeon: Petra Kuba, MD;  Location: [facility];  Service: Endoscopy;  Laterality: N/A;    Family History  Problem Relation Age of Onset  . COPD Mother   . Heart failure Mother   . Heart failure Father   . Alcohol abuse Mother   . Alcohol abuse Brother     Social History Kristina Horton reports that she quit smoking about 34 years ago. Her smoking use included Cigarettes. She has a 54 pack-year smoking history. She has never used smokeless tobacco. Kristina Horton reports that she does not drink alcohol.  Review of Systems No sense of palpitations. Intermittent dry cough. No frank orthopnea or PND. No leg edema. No exertional chest pain or regular wheezing. Uses roller walker. No recent falls. Otherwise negative except as outlined.  Physical Examination Filed Vitals:   10/24/12 1547  BP: 122/76  Pulse: 115   Filed Weights   10/24/12 1547  Weight: 168 lb 4 oz (76.318 kg)   No acute distress, seated. HEENT: Conjunctiva and lids normal. Neck: No elevated JVP or carotid bruits, no thyromegaly. Lungs: Clear to auscultation, nonlabored breathing at rest. Cardiac: Regular rate and rhythm, no S3, soft systolic murmur - likely flow, no pericardial rub. Abdomen: Soft, nontender, bowel sounds present. Extremities: No pitting edema, distal pulses 1-2+. Skin: Warm and dry. Musculoskeletal: No kyphosis. Neuropsychiatric: Alert and  oriented x3, affect grossly appropriate.   Problem List and Plan   Preoperative cardiovascular examination Evaluation prior to elective right hip surgery as outlined. At baseline there is no known history of obstructive CAD or myocardial infarction, no cardiomyopathy or previous arrhythmia. It is reassuring that she has tolerated previous surgeries without obvious cardiac complication. On the other hand, at this point she is functionally limited due to hip pain, difficult to assess maximum tolerated METs based on description of activity. Her ECG is abnormal as noted above and she is persistently tachycardic based on review of vital signs over time, also aware that her heart rate stays up based on discussion today. She does have some family history of heart disease, specifically her father, and personal risk factors including hypertension, no tobacco use for several years. We discussed the situation today, and plan will be further cardiac risk stratification via an echocardiogram to exclude cardiomyopathy in light of her elevated resting heart rate and abnormal ECG, also assessment of ischemia via Lexiscan Myoview. Office will call her with the results, and we can arrange additional followup if further cardiac testing is needed. Will also forward additional recommendations to Dr. Juanetta Gosling.  Essential hypertension, benign Blood pressure today is normal.    Jonelle Sidle, M.D., F.A.C.C.

## 2012-10-24 NOTE — Progress Notes (Signed)
This patient has a host of issues that she is containing with at this time. 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  The pain medications unfortunately give her more depression at times she states. She is going to see her pain care management Dr. tomorrow and hopefully he will work on this.  The other issue she is containing with his depression I think all by itself because of the lack of self sufficiency and decreased function appear  She is also continuing with what appears to be Sjogren's syndrome which is being followed by Dr. Corliss Skains.  She also has bilateral knee arthritis but may need knee replacements down the road.  On top of her depression she also is very anxious. She also has a history of 54 pack years of smoking and because of that needs a screening CT scan of the chest. We will schedule that.  With her smoking history she also needs heart clearance prior to the right hip replacement surgery. She is due to have a stress test soon by Dr. Diona Browner.  So we basically spoke a long time more involved with counseling anything else. Her recent blood work in February showed no anemia in a very good ferritin level.  We will reassess her iron studies the CBC today and since she was also hypokalemic in the past we will check that for her and Dr. Juanetta Gosling as well.  We'll be in touch with her soon as we hear from the serious tests

## 2012-10-24 NOTE — Patient Instructions (Addendum)
Your physician recommends that you schedule a follow-up appointment in: To be determined   Your physician has requested that you have a lexiscan myoview. For further information please visit [email or web address] Please follow instruction sheet, as given.  Your physician has requested that you have an echocardiogram. Echocardiography is a painless test that uses sound waves to create images of your heart. It provides your doctor with information about the size and shape of your heart and how well your heart's chambers and valves are working. This procedure takes approximately one hour. There are no restrictions for this procedure.  Lexiscan Stress Electrocardiography Your caregiver has ordered a Lexiscan Stress Test with nuclear imaging. The purpose of this test is to evaluate the blood supply to your heart muscle. This procedure is referred to as a "Non-Invasive Stress Test." This is because other than having an IV started in your vein, nothing is inserted or "invades" your body. Cardiac stress tests are done to find areas of poor blood flow to the heart by determining the extent of coronary artery disease (CAD). Some patients exercise on a treadmill, which naturally increases the blood flow to your heart. However, almost half of the patients undergoing a treadmill stress test each year are unable to exercise adequately. This is due to various medical conditions. For these patients, a pharmacologic/chemical stress agent called Eugenie Birks is used on patients without a history of asthma. This medicine will mimic walking on a treadmill by temporarily increasing your coronary blood flow. The side effects of this medicine include:  Headache.  Dizziness.  Shortness of breath.  Flushing.  Chest pain/pressure.  Feeling sick to your stomach (nauseous).  Increased heart rate.  Abdominal discomfort.  Low blood pressure. BEFORE THE PROCEDURE   Avoid all forms of caffeine 24 hours before your test.  This includes coffee, tea (even decaffeinated brands), caffeinated sodas, chocolate, cocoa and certain pain medications that contain caffeine.  Do not eat anything 6 hours before the test. In hospitalized patients, this means nothing by mouth after midnight.  Patients with diabetes that are not hospitalized (outpatients) should talk to their caregiver about how much, if any, insulin or oral diabetic medications they should take the day of the test. You should also talk about the type of light snack that you should eat the morning of the test.  Patients with diabetes that are hospitalized (inpatients) will follow the cardiologist's written instructions that will be given to you.  Outpatients should bring a snack. Use the hospital vending machines so that you may eat right after the stress phase.  Wear comfortable shoes. There is at least an hour wait before the stress phase of nuclear scanning is done. The total procedure time is approximately 3 hours.  The following medications should be stopped 24 hours before your stress test: Beta-blockers and nitroglycerine (patches or paste should be removed 4 hours before the test).  Women, tell your caregiver if there is any possibility that you may be pregnant or if you are currently breastfeeding. PROCEDURE  There are two separate nuclear images taken using a nuclear camera. These images require a small dose of a radiotracer called an isotope. Most diagnostic nuclear medicine procedures result in low radiation exposure. Allergic reactions to the isotope may include slight redness going up the arm and pain during the injection. However, these reactions are extremely rare and usually mild. Eugenie Birks is given very rapidly over 10-15 seconds in the vein (intravenously) followed by a nuclear isotope. The medication causes the arteries in  your heart to widen (dilate), and the nuclear isotope lights up those arteries so that the nuclear images are clear. Together,  this shows whether the coronary blood flow is normal or abnormal. During this stress phase, you will be connected to an EKG machine. Your blood pressure and oxygen levels will be monitored by the cardiologist and cardiac nurse. This part usually lasts 5-10 minutes. For inpatients, the procedure is done in the patient's room. For outpatients, the procedure is done in the stress lab.  The "Resting Phase" is done before the Lexiscan injection and shows how your heart functions at rest. The "Stress Phase" is done about 1 hour after the Lexiscan injection and determines how your heart functions under stress.  LEXISCAN STRESS TEST IS USEFUL FOR DETERMINING:  The adequacy of blood flow to your heart during increased levels of activity in order to clear you for discharge home.  The extent of coronary artery blockage caused by CAD.  Your prognosis if you have suffered a heart attack.  The effectiveness of cardiac procedures done, such as an angioplasty, which can increase the circulation in your coronary arteries.  Cause(s) of chest pain/pressure. Finding out the results of your test  Ask when your test results will be ready. Make sure you get your test results. Document Released: 11/14/2008 Document Revised: 09/20/2011 Document Reviewed: 11/14/2008 [facility] Patient Information 2013 [place], [place].

## 2012-10-24 NOTE — Assessment & Plan Note (Addendum)
Evaluation prior to elective right hip surgery as outlined. At baseline there is no known history of obstructive CAD or myocardial infarction, no cardiomyopathy or previous arrhythmia. It is reassuring that she has tolerated previous surgeries without obvious cardiac complication. On the other hand, at this point she is functionally limited due to hip pain, difficult to assess maximum tolerated METs based on description of activity. Her ECG is abnormal as noted above and she is persistently tachycardic based on review of vital signs over time, also aware that her heart rate stays up based on discussion today. She does have some family history of heart disease, specifically her father, and personal risk factors including hypertension, no tobacco use for several years. We discussed the situation today, and plan will be further cardiac risk stratification via an echocardiogram to exclude cardiomyopathy in light of her elevated resting heart rate and abnormal ECG, also assessment of ischemia via Lexiscan Myoview. Office will call her with the results, and we can arrange additional followup if further cardiac testing is needed. Will also forward additional recommendations to Dr. Juanetta Gosling.

## 2012-10-24 NOTE — Patient Instructions (Addendum)
[facility] Discharge Instructions  RECOMMENDATIONS MADE BY THE CONSULTANT AND ANY TEST RESULTS WILL BE SENT TO YOUR REFERRING PHYSICIAN.  EXAM FINDINGS BY THE PHYSICIAN TODAY AND SIGNS OR SYMPTOMS TO REPORT TO CLINIC OR PRIMARY PHYSICIAN: Exam and discussion by MD.  We will draw some lab work today to check your chemistries and your iron studies.  We will also do CT of your Chest on Thursday.  Nothing to eat or drink 4 hours prior.  MEDICATIONS PRESCRIBED:  none  INSTRUCTIONS GIVEN AND DISCUSSED: Report increased fatigue or increased ice intake.  SPECIAL INSTRUCTIONS/FOLLOW-UP: Follow-up in 3 - 4 months.  Thank you for choosing [facility] to provide your oncology and hematology care.  To afford each patient quality time with our providers, please arrive at least 15 minutes before your scheduled appointment time.  With your help, our goal is to use those 15 minutes to complete the necessary work-up to ensure our physicians have the information they need to help with your evaluation and healthcare recommendations.    Effective January 1st, 2014, we ask that you re-schedule your appointment with our physicians should you arrive 10 or more minutes late for your appointment.  We strive to give you quality time with our providers, and arriving late affects you and other patients whose appointments are after yours.    Again, thank you for choosing [facility].  Our hope is that these requests will decrease the amount of time that you wait before being seen by our physicians.       _____________________________________________________________  Should you have questions after your visit to [facility], please contact our office at [phone] between the hours of 8:30 a.m. and 5:00 p.m.  Voicemails left after 4:30 p.m. will not be returned until the following business day.  For prescription refill requests, have your pharmacy contact our  office with your prescription refill request.

## 2012-10-24 NOTE — Assessment & Plan Note (Signed)
Blood pressure today is normal. 

## 2012-10-25 ENCOUNTER — Ambulatory Visit: Payer: Medicare PPO | Admitting: Emergency Medicine

## 2012-10-25 ENCOUNTER — Telehealth (HOSPITAL_COMMUNITY): Payer: Self-pay

## 2012-10-25 LAB — IRON AND TIBC
Iron: 44 ug/dL (ref 42–135)
UIBC: 302 ug/dL (ref 125–400)

## 2012-10-25 LAB — FERRITIN: Ferritin: 86 ng/mL (ref 10–291)

## 2012-10-25 NOTE — Telephone Encounter (Signed)
Message copied by Evelena Leyden on Wed Oct 25, 2012 10:19 AM ------      Message from: Mariel Sleet, ERIC S      Created: Wed Oct 25, 2012 10:00 AM       Call her please      CBC is excellent      K+ to be handled by Dr Ishmael Holter to him this am)      Await iron studies ------

## 2012-10-25 NOTE — Telephone Encounter (Signed)
Message left on patient's voicemail.  To call back with questions. 

## 2012-10-26 ENCOUNTER — Ambulatory Visit (HOSPITAL_COMMUNITY)
Admission: RE | Admit: 2012-10-26 | Discharge: 2012-10-26 | Disposition: A | Discharge status: Home / Self Care | Payer: Medicare PPO | Source: Ambulatory Visit | Attending: Cardiology | Admitting: Cardiology

## 2012-10-26 ENCOUNTER — Ambulatory Visit (HOSPITAL_COMMUNITY)
Admission: RE | Admit: 2012-10-26 | Discharge: 2012-10-26 | Disposition: A | Discharge status: Home / Self Care | Payer: Medicare PPO | Source: Ambulatory Visit | Attending: Oncology | Admitting: Oncology

## 2012-10-26 DIAGNOSIS — I10 Essential (primary) hypertension: Secondary | ICD-10-CM | POA: Insufficient documentation

## 2012-10-26 DIAGNOSIS — J4489 Other specified chronic obstructive pulmonary disease: Secondary | ICD-10-CM | POA: Insufficient documentation

## 2012-10-26 DIAGNOSIS — R918 Other nonspecific abnormal finding of lung field: Secondary | ICD-10-CM | POA: Insufficient documentation

## 2012-10-26 DIAGNOSIS — R0989 Other specified symptoms and signs involving the circulatory and respiratory systems: Secondary | ICD-10-CM | POA: Insufficient documentation

## 2012-10-26 DIAGNOSIS — I517 Cardiomegaly: Secondary | ICD-10-CM

## 2012-10-26 DIAGNOSIS — Z01810 Encounter for preprocedural cardiovascular examination: Secondary | ICD-10-CM

## 2012-10-26 DIAGNOSIS — J449 Chronic obstructive pulmonary disease, unspecified: Secondary | ICD-10-CM

## 2012-10-26 DIAGNOSIS — R0609 Other forms of dyspnea: Secondary | ICD-10-CM | POA: Insufficient documentation

## 2012-10-26 DIAGNOSIS — R9431 Abnormal electrocardiogram [ECG] [EKG]: Secondary | ICD-10-CM

## 2012-10-26 DIAGNOSIS — R0600 Dyspnea, unspecified: Secondary | ICD-10-CM

## 2012-10-26 DIAGNOSIS — R053 Chronic cough: Secondary | ICD-10-CM

## 2012-10-26 DIAGNOSIS — R05 Cough: Secondary | ICD-10-CM

## 2012-10-26 DIAGNOSIS — R059 Cough, unspecified: Secondary | ICD-10-CM | POA: Insufficient documentation

## 2012-10-26 DIAGNOSIS — I4510 Unspecified right bundle-branch block: Secondary | ICD-10-CM | POA: Insufficient documentation

## 2012-10-26 NOTE — Progress Notes (Signed)
*  PRELIMINARY RESULTS* Echocardiogram 2D Echocardiogram has been performed.  Kristina Horton 10/26/2012, 3:30 PM

## 2012-10-27 ENCOUNTER — Encounter: Payer: Self-pay | Admitting: Cardiology

## 2012-10-30 ENCOUNTER — Encounter (HOSPITAL_COMMUNITY)
Admission: RE | Admit: 2012-10-30 | Discharge: 2012-10-30 | Disposition: A | Discharge status: Home / Self Care | Payer: Medicare PPO | Source: Ambulatory Visit | Attending: Cardiology | Admitting: Cardiology

## 2012-10-30 ENCOUNTER — Encounter (HOSPITAL_COMMUNITY): Admission: RE | Admit: 2012-10-30 | Payer: Medicare PPO | Source: Ambulatory Visit

## 2012-10-30 ENCOUNTER — Inpatient Hospital Stay (HOSPITAL_COMMUNITY): Admission: RE | Admit: 2012-10-30 | Payer: Medicare PPO | Source: Ambulatory Visit

## 2012-10-30 ENCOUNTER — Encounter: Payer: Self-pay | Admitting: *Deleted

## 2012-10-30 ENCOUNTER — Telehealth: Payer: Self-pay | Admitting: Cardiology

## 2012-10-30 DIAGNOSIS — R9431 Abnormal electrocardiogram [ECG] [EKG]: Secondary | ICD-10-CM

## 2012-10-30 DIAGNOSIS — Z01810 Encounter for preprocedural cardiovascular examination: Secondary | ICD-10-CM

## 2012-10-30 DIAGNOSIS — R0600 Dyspnea, unspecified: Secondary | ICD-10-CM

## 2012-10-30 NOTE — Telephone Encounter (Signed)
Spoke to pt to advise results/instructions. Pt understood. Pt advised that she wanted to know if MD Mcdowell will clear her for surgery based on her echo results alone per pt had to reschedule her Stress Test this am to 11-06-12 in [place] office and her right hip replacement surgery is scheduled for 11-08-12, pt pre-op is tomorrow, this nurse call MD Mcdowell and he advised the pt will need to have the Stress Myoview testing to clear her for surgery to rule out ischemia, pt understood and will have the test performed at [address], contacted [facility] per noted pt apt scheduled at EXT and not Stress test, was advised [address] office places orders different in chart that our office, per notation on pt notes to advise this will be a stress myoview per MD Mcdowell, pt understood all instructions, apologized for having to re-schedule her stress test, pt understood and will await results of test to proceed with surgery.

## 2012-10-30 NOTE — Telephone Encounter (Signed)
.  left message to have patient return my call.  

## 2012-10-30 NOTE — Telephone Encounter (Signed)
Results of echo / tgs  °

## 2012-11-01 ENCOUNTER — Encounter (HOSPITAL_BASED_OUTPATIENT_CLINIC_OR_DEPARTMENT_OTHER): Payer: Medicare PPO

## 2012-11-01 ENCOUNTER — Telehealth (HOSPITAL_COMMUNITY): Payer: Self-pay | Admitting: Oncology

## 2012-11-01 ENCOUNTER — Other Ambulatory Visit: Payer: Self-pay

## 2012-11-01 VITALS — BP 118/68 | HR 100 | Temp 98.9°F | Resp 18

## 2012-11-01 DIAGNOSIS — D509 Iron deficiency anemia, unspecified: Secondary | ICD-10-CM

## 2012-11-01 MED ORDER — SODIUM CHLORIDE 0.9 % IV SOLN
Freq: Once | INTRAVENOUS | Status: AC
  Administered 2012-11-01: 14:00:00 via INTRAVENOUS

## 2012-11-01 MED ORDER — FERUMOXYTOL INJECTION 510 MG/17 ML
510.0000 mg | Freq: Once | INTRAVENOUS | Status: AC
  Administered 2012-11-01: 510 mg via INTRAVENOUS
  Filled 2012-11-01: qty 17

## 2012-11-01 NOTE — Progress Notes (Signed)
Kristina Horton presents today for injection per MD orders. feraheme 510 mg administered IV in left antecubital. Administration without incident. Patient tolerated well.

## 2012-11-01 NOTE — Telephone Encounter (Signed)
[employer] [phone] ?'D IF CPT Q0138 FERAHEME REQUIRED AUTH PER RENEE NO AUTH IS REQUIRED. JXB#[id number]

## 2012-11-02 ENCOUNTER — Ambulatory Visit: Payer: Medicare PPO | Admitting: Emergency Medicine

## 2012-11-06 ENCOUNTER — Encounter (HOSPITAL_COMMUNITY): Payer: Medicare PPO

## 2012-11-06 ENCOUNTER — Ambulatory Visit (HOSPITAL_COMMUNITY): Payer: Medicare PPO | Attending: Cardiology | Admitting: Radiology

## 2012-11-06 VITALS — BP 116/56 | HR 81 | Ht <= 58 in | Wt 175.0 lb

## 2012-11-06 DIAGNOSIS — R5381 Other malaise: Secondary | ICD-10-CM | POA: Insufficient documentation

## 2012-11-06 DIAGNOSIS — Z01810 Encounter for preprocedural cardiovascular examination: Secondary | ICD-10-CM

## 2012-11-06 DIAGNOSIS — R0609 Other forms of dyspnea: Secondary | ICD-10-CM | POA: Insufficient documentation

## 2012-11-06 DIAGNOSIS — I4510 Unspecified right bundle-branch block: Secondary | ICD-10-CM

## 2012-11-06 DIAGNOSIS — I10 Essential (primary) hypertension: Secondary | ICD-10-CM | POA: Insufficient documentation

## 2012-11-06 DIAGNOSIS — R9431 Abnormal electrocardiogram [ECG] [EKG]: Secondary | ICD-10-CM

## 2012-11-06 DIAGNOSIS — R0602 Shortness of breath: Secondary | ICD-10-CM

## 2012-11-06 DIAGNOSIS — R0600 Dyspnea, unspecified: Secondary | ICD-10-CM

## 2012-11-06 DIAGNOSIS — Z87891 Personal history of nicotine dependence: Secondary | ICD-10-CM | POA: Insufficient documentation

## 2012-11-06 DIAGNOSIS — Z8249 Family history of ischemic heart disease and other diseases of the circulatory system: Secondary | ICD-10-CM | POA: Insufficient documentation

## 2012-11-06 DIAGNOSIS — R5383 Other fatigue: Secondary | ICD-10-CM | POA: Insufficient documentation

## 2012-11-06 DIAGNOSIS — R0989 Other specified symptoms and signs involving the circulatory and respiratory systems: Secondary | ICD-10-CM | POA: Insufficient documentation

## 2012-11-06 MED ORDER — TECHNETIUM TC 99M SESTAMIBI GENERIC - CARDIOLITE
33.0000 | Freq: Once | INTRAVENOUS | Status: AC | PRN
  Administered 2012-11-06: 33 via INTRAVENOUS

## 2012-11-06 MED ORDER — TECHNETIUM TC 99M SESTAMIBI GENERIC - CARDIOLITE
11.0000 | Freq: Once | INTRAVENOUS | Status: AC | PRN
  Administered 2012-11-06: 11 via INTRAVENOUS

## 2012-11-06 MED ORDER — REGADENOSON 0.4 MG/5ML IV SOLN
0.4000 mg | Freq: Once | INTRAVENOUS | Status: AC
  Administered 2012-11-06: 0.4 mg via INTRAVENOUS

## 2012-11-06 NOTE — Progress Notes (Signed)
[facility] SITE 3 NUCLEAR MED [address] [place], [place] [address] [phone]    Cardiology Nuclear Med Study  Kristina Horton is a 67 y.o. female     MRN : [id number]     DOB: 01-01-1946  Procedure Date: 11/06/2012  Nuclear Med Background Indication for Stress Test:  Evaluation for Ischemia and Pending Surgical Clearance for (R) THR on 11/08/12 by Dr. Tenna Delaine History:  ~15 yrs ago MPS>Cath:normal coronaries ([facility]-per pt, no report) Cardiac Risk Factors: Family History - CAD, History of Smoking, Hypertension and RBBB  Symptoms:  DOE and Chronic Fatigue   Nuclear Pre-Procedure Caffeine/Decaff Intake:  None NPO After: 8:00pm   Lungs:  Clear. O2 Sat: 96% on room air. IV 0.9% NS with Angio Cath:  22g  IV Site: R Forearm  IV Started by:  Stanton Kidney, EMT-P  Chest Size (in):  42 Cup Size: C  Height: 4\' 10"  (1.473 m)  Weight:  175 lb (79.379 kg)  BMI:  Body mass index is 36.58 kg/(m^2). Tech Comments:  Med's were taken as directed, per patient.    Nuclear Med Study 1 or 2 day study: 1 day  Stress Test Type:  Lexiscan  Reading MD: Olga Millers, MD  Order Authorizing Provider:  Nona Dell, MD  Resting Radionuclide: Technetium 10m Sestamibi  Resting Radionuclide Dose: 11.0 mCi   Stress Radionuclide:  Technetium 41m Sestamibi  Stress Radionuclide Dose: 33.0 mCi           Stress Protocol Rest HR: 81 Stress HR: 99  Rest BP: 116/56 Stress BP: 111/52  Exercise Time (min): n/a METS: n/a   Predicted Max HR: 153 bpm % Max HR: 64.71 bpm Rate Pressure Product: 10272   Dose of Adenosine (mg):  n/a Dose of Lexiscan: 0.4 mg  Dose of Atropine (mg): n/a Dose of Dobutamine: n/a mcg/kg/min (at max HR)  Stress Test Technologist: Smiley Houseman, CMA-N  Nuclear Technologist:  Domenic Polite, CNMT     Rest Procedure:  Myocardial perfusion imaging was performed at rest 45 minutes following the intravenous administration of Technetium 5m Sestamibi.  Rest ECG:  NSR-RBBB, LAD  Stress Procedure:  The patient received IV Lexiscan 0.4 mg over 15-seconds.  Technetium 89m Sestamibi injected at 30-seconds.  Quantitative spect images were obtained after a 45 minute delay.  Stress ECG: No significant ST segment change suggestive of ischemia.  QPS Raw Data Images:  Acquisition technically good; normal left ventricular size. Stress Images:  Normal homogeneous uptake in all areas of the myocardium. Rest Images:  Normal homogeneous uptake in all areas of the myocardium. Subtraction (SDS):  No evidence of ischemia. Transient Ischemic Dilatation (Normal <1.22):  1.14 Lung/Heart Ratio (Normal <0.45):  0.15  Quantitative Gated Spect Images QGS EDV:  71 ml QGS ESV:  20 ml  Impression Exercise Capacity:  Lexiscan with no exercise. BP Response:  Normal blood pressure response. Clinical Symptoms:  There is dyspnea. ECG Impression:  No significant ST segment change suggestive of ischemia. Comparison with Prior Nuclear Study: No images to compare  Overall Impression:  Normal stress nuclear study.  LV Ejection Fraction: 72%.  LV Wall Motion:  NL LV Function; NL Wall Motion   Olga Millers

## 2012-11-13 ENCOUNTER — Ambulatory Visit: Payer: Medicare PPO | Admitting: Cardiology

## 2012-11-22 ENCOUNTER — Other Ambulatory Visit (HOSPITAL_COMMUNITY): Payer: Managed Care, Other (non HMO)

## 2012-11-22 ENCOUNTER — Other Ambulatory Visit (HOSPITAL_COMMUNITY): Payer: Self-pay | Admitting: Oncology

## 2013-02-02 ENCOUNTER — Other Ambulatory Visit (HOSPITAL_COMMUNITY): Payer: Self-pay | Admitting: Gastroenterology

## 2013-02-02 DIAGNOSIS — R112 Nausea with vomiting, unspecified: Secondary | ICD-10-CM

## 2013-02-15 ENCOUNTER — Encounter (HOSPITAL_COMMUNITY): Payer: Medicare PPO

## 2013-02-16 ENCOUNTER — Encounter (HOSPITAL_COMMUNITY): Payer: Self-pay

## 2013-02-16 ENCOUNTER — Encounter (HOSPITAL_COMMUNITY)
Admission: RE | Admit: 2013-02-16 | Discharge: 2013-02-16 | Disposition: A | Discharge status: Home / Self Care | Payer: Medicare PPO | Source: Ambulatory Visit | Attending: Gastroenterology | Admitting: Gastroenterology

## 2013-02-16 DIAGNOSIS — K3189 Other diseases of stomach and duodenum: Secondary | ICD-10-CM | POA: Insufficient documentation

## 2013-02-16 DIAGNOSIS — R112 Nausea with vomiting, unspecified: Secondary | ICD-10-CM | POA: Insufficient documentation

## 2013-02-16 MED ORDER — TECHNETIUM TC 99M SULFUR COLLOID
2.2000 | Freq: Once | INTRAVENOUS | Status: AC | PRN
  Administered 2013-02-16: 2.2 via ORAL

## 2013-02-21 ENCOUNTER — Other Ambulatory Visit (HOSPITAL_COMMUNITY): Payer: Self-pay

## 2013-02-22 ENCOUNTER — Other Ambulatory Visit (HOSPITAL_COMMUNITY): Payer: Managed Care, Other (non HMO)

## 2013-02-23 ENCOUNTER — Ambulatory Visit (HOSPITAL_COMMUNITY): Payer: Managed Care, Other (non HMO) | Admitting: Oncology

## 2013-02-26 ENCOUNTER — Ambulatory Visit (HOSPITAL_COMMUNITY): Payer: Managed Care, Other (non HMO)

## 2013-02-27 ENCOUNTER — Encounter (HOSPITAL_COMMUNITY): Payer: Medicare PPO | Attending: Hematology and Oncology

## 2013-02-27 DIAGNOSIS — D509 Iron deficiency anemia, unspecified: Secondary | ICD-10-CM | POA: Insufficient documentation

## 2013-02-27 LAB — CBC
HCT: 39 % (ref 36.0–46.0)
MCH: 31.1 pg (ref 26.0–34.0)
MCV: 93.3 fL (ref 78.0–100.0)
RDW: 12.7 % (ref 11.5–15.5)
WBC: 6.5 10*3/uL (ref 4.0–10.5)

## 2013-02-27 NOTE — Progress Notes (Unsigned)
Kristina Horton presented for labwork. Labs per MD order drawn via Peripheral Line 23 gauge needle inserted in right upper forearm  Good blood return present. Procedure without incident.  Needle removed intact. Patient tolerated procedure well.  Additional blood drawn per request of the patient and requisitions for lab tests for Dr. Corliss Skains & Dr. Juanetta Gosling sent to lab with specimens.

## 2013-02-28 ENCOUNTER — Encounter (HOSPITAL_COMMUNITY): Payer: Self-pay

## 2013-03-06 ENCOUNTER — Encounter (HOSPITAL_COMMUNITY): Payer: Self-pay

## 2013-03-06 ENCOUNTER — Encounter (HOSPITAL_COMMUNITY): Payer: Medicare PPO | Attending: Gastroenterology

## 2013-03-06 VITALS — BP 101/56 | HR 64 | Temp 97.7°F | Resp 14 | Wt 165.0 lb

## 2013-03-06 DIAGNOSIS — Z800 Family history of malignant neoplasm of digestive organs: Secondary | ICD-10-CM

## 2013-03-06 DIAGNOSIS — R52 Pain, unspecified: Secondary | ICD-10-CM

## 2013-03-06 DIAGNOSIS — D509 Iron deficiency anemia, unspecified: Secondary | ICD-10-CM

## 2013-03-06 NOTE — Progress Notes (Signed)
[facility] [phone]   [phone]  OFFICE PROGRESS NOTE  Kristina Maudlin, MD [address] [place] [address]  DIAGNOSIS: History of iron deficiency anemia.   INTERVAL HISTORY:   Kristina Horton is a 67 year old woman home we see for iron deficiency anemia. She gets periodic IV iron and was last treated on 11/01/2012.  She does have multiple pain related complaints as detailed in her past medical history. Last colonoscopy was on 05/29/2012 which showed a 'tiny hemorhoids' otherwise normal findings. Also EGD was on 05/19/2012 showed hiatal hernia, minimal antritis, few gastric erection otherwise normal EGD. Except for her multiple arthritis pain complaints and 3 sleepy from multiple medications she has no new complaints.  She returns to the clinic today for  Scheduled follow up accompanied by her husband.   MEDICAL HISTORY: Past Medical History  Diagnosis Date  . Essential hypertension, benign   . IBS (irritable bowel syndrome)   . IC (interstitial cystitis)   . Allergic rhinitis   . Fibromyalgia   . Osteoarthritis   . Iron deficiency anemia 05/25/2012    Requiring IV Feraheme     ALLERGIES:  is allergic to demerol; fluconazole; iodine; shellfish-derived products; sulfa drugs cross reactors; and tape.  MEDICATIONS: Reviewed.  SURGICAL HISTORY:  Past Surgical History  Procedure Laterality Date  . Spinal fusion  2010    C2-T2, done in [place]  . Vesicovaginal fistula closure w/ tah  1998  . Appendectomy  1974  . Breast surgery  1962    benign rumor  . Thyroidectomy, partial    . Vesico-vaginal fistula repair  1997  . Lumbar fusion      2011  . Anterior cervical decomp/discectomy fusion    . Colonoscopy with propofol  05/29/2012    Procedure: COLONOSCOPY WITH PROPOFOL;  Surgeon: Petra Kuba, MD;  Location: [facility];  Service: Endoscopy;  Laterality: N/A;  needs general  . Esophagogastroduodenoscopy  (egd) with propofol  05/29/2012    Procedure: ESOPHAGOGASTRODUODENOSCOPY (EGD) WITH PROPOFOL;  Surgeon: Petra Kuba, MD;  Location: [facility];  Service: Endoscopy;  Laterality: N/A;     REVIEW OF SYSTEMS: 14 point review of system is as in the history above otherwise negative.  PHYSICAL EXAMINATION:  Blood pressure 101/56, pulse 64, temperature 97.7 F (36.5 C), temperature source Oral, resp. rate 14, weight 165 lb (74.844 kg). GENERAL: No acute distress. Wheelchair confined SKIN:  No rashes or significant lesions . No ecchymosis or petechial rash. HEAD: Normocephalic, No masses, lesions, tenderness or abnormalities  EYES: Conjunctiva are pink and non-injected and no jaundice BREAST:Normal without mass, skin or nipple changes or axillary nodes,  LUNGS: Clear to auscultation , no crackles or wheezes HEART: regular rate & rhythm, no murmurs, no gallops, S1 normal and S2 normal and no S3. ABDOMEN: Abdomen soft, non-tender. EXTREMITIES: No edema, no skin discoloration or tenderness NEURO: Alert & oriented , somewhat lethargic.     LABORATORY DATA: Lab Results  Component Value Date   WBC 6.5 02/27/2013   HGB 13.0 02/27/2013   HCT 39.0 02/27/2013   MCV 93.3 02/27/2013   PLT 250 02/27/2013   Ferritin 02/27/2013 was 108 ng per ml    ASSESSMENT:  History of Iron deficiency anemia. Patient is not anemic or iron deficent at this time as her iron stores are replete. We talked about her concern for gastric cancer given her family history of 'stomach cancer' in  one  brother .  She had negative EGD in the past the past year. Patient lethargy is most likely from multiple psychoactive medications.  I will leave the management to your discretion of his primary and respective M.Ds.  PLAN:  1. Return to clinic in 4 months. 2. Repeat iron studies at tat the next of the next visit    All questions were satisfactorily answered. Patient knows to call if  any concern arises.  I spent more than  50 % counseling the patient face to face. The total time spent in the appointment was 30 minutes.   Sherral Hammers, MD FACP. Hematology/Oncology.   Copy Dr Pollyann Savoy.

## 2013-03-06 NOTE — Patient Instructions (Addendum)
[facility] Discharge Instructions  RECOMMENDATIONS MADE BY THE CONSULTANT AND ANY TEST RESULTS WILL BE SENT TO YOUR REFERRING PHYSICIAN.  EXAM FINDINGS BY THE PHYSICIAN TODAY AND SIGNS OR SYMPTOMS TO REPORT TO CLINIC OR PRIMARY PHYSICIAN: Exam and discussion by Dr. Lazarus Salines.  Blood work was good.  No need to do anything different at the present.  MEDICATIONS PRESCRIBED:  none  INSTRUCTIONS GIVEN AND DISCUSSED: Report increased fatigue or shortness of breath.  SPECIAL INSTRUCTIONS/FOLLOW-UP: 4 months for blood work and follow-up.  Thank you for choosing [facility] to provide your oncology and hematology care.  To afford each patient quality time with our providers, please arrive at least 15 minutes before your scheduled appointment time.  With your help, our goal is to use those 15 minutes to complete the necessary work-up to ensure our physicians have the information they need to help with your evaluation and healthcare recommendations.    Effective January 1st, 2014, we ask that you re-schedule your appointment with our physicians should you arrive 10 or more minutes late for your appointment.  We strive to give you quality time with our providers, and arriving late affects you and other patients whose appointments are after yours.    Again, thank you for choosing [facility].  Our hope is that these requests will decrease the amount of time that you wait before being seen by our physicians.       _____________________________________________________________  Should you have questions after your visit to [facility], please contact our office at [phone] between the hours of 8:30 a.m. and 5:00 p.m.  Voicemails left after 4:30 p.m. will not be returned until the following business day.  For prescription refill requests, have your pharmacy contact our office with your prescription refill request.

## 2013-05-17 ENCOUNTER — Other Ambulatory Visit: Payer: Self-pay

## 2013-06-19 ENCOUNTER — Encounter (INDEPENDENT_AMBULATORY_CARE_PROVIDER_SITE_OTHER): Payer: Self-pay | Admitting: *Deleted

## 2013-07-02 ENCOUNTER — Other Ambulatory Visit (HOSPITAL_COMMUNITY): Payer: Medicare PPO

## 2013-07-03 ENCOUNTER — Ambulatory Visit (INDEPENDENT_AMBULATORY_CARE_PROVIDER_SITE_OTHER): Payer: Medicare PPO | Admitting: Internal Medicine

## 2013-07-03 ENCOUNTER — Ambulatory Visit (HOSPITAL_COMMUNITY): Payer: Medicare PPO | Admitting: Oncology

## 2013-07-03 ENCOUNTER — Encounter (INDEPENDENT_AMBULATORY_CARE_PROVIDER_SITE_OTHER): Payer: Self-pay | Admitting: Internal Medicine

## 2013-07-03 VITALS — BP 146/80 | HR 88 | Temp 99.6°F | Ht <= 58 in | Wt 155.0 lb

## 2013-07-03 DIAGNOSIS — K3184 Gastroparesis: Secondary | ICD-10-CM

## 2013-07-03 NOTE — Progress Notes (Signed)
Subjective:     Patient ID: Kristina Horton, female   DOB: Dec 09, 1945, 67 y.o.   MRN: [id number]  HPI Referred to our office by Dr. Juanetta Gosling for gastroparesis. She tells me she has had nausea and vomiting since June of 2014. She is in tears while in the office. She continues to have nausea and vomiting. She tells me her BS levels have been elevated butt not excessively.  She has seen Dr. Darlen Round in the past for this. (As recently as July of 2014).  She has taken erythromycin in the past for her gastroparesis but really did not help.  She does not eat 3 meals a day. She has lost 40 pounds in the past 4 months.  She is eating in small portions.  She is eating 4-5 meals a day. She is avoiding fatty foods. She has a BM daily. Sometimes she will have diarrhea.  For the most part, her appetite is not good.  She does have bloating. She says she use to enjoy food, but she knows it is going to cause nausea and vomiting.  Has appt with Dr. Alycia Rossetti at [facility]  (stomach motility specialist)  02/02/2013 NM Gastric emptying study: N and V: IMPRESSION:  Marked delay in gastric emptying with nearly all of the ingested  activity remaining in the stomach at 2 hours.    05/29/2012 Colonoscopy/EGD Dr. Ewing Schlein: Colonscopy: Tiny hemorrhoids, otherwise normal to terminal ileum. EGD: Tiny hiatal hernia, Minimal antritis. A few tiny gastric erosion. Otherwise normal EGD;.  CBC    Component Value Date/Time   WBC 6.5 02/27/2013 1625   RBC 4.18 02/27/2013 1625   RBC 3.94 07/17/2010 1612   HGB 13.0 02/27/2013 1625   HCT 39.0 02/27/2013 1625   PLT 250 02/27/2013 1625   MCV 93.3 02/27/2013 1625   MCH 31.1 02/27/2013 1625   MCHC 33.3 02/27/2013 1625   RDW 12.7 02/27/2013 1625   LYMPHSABS 1.4 10/24/2012 1740   MONOABS 0.7 10/24/2012 1740   EOSABS 0.2 10/24/2012 1740   BASOSABS 0.0 10/24/2012 1740    Iron/TIBC/Ferritin    Component Value Date/Time   IRON 44 10/24/2012 1740   TIBC 346 10/24/2012 1740   FERRITIN 108 02/27/2013  1625     Review of Systems Past Medical History  Diagnosis Date  . Essential hypertension, benign   . IBS (irritable bowel syndrome)   . IC (interstitial cystitis)   . Allergic rhinitis   . Fibromyalgia   . Osteoarthritis   . Iron deficiency anemia 05/25/2012    Requiring IV Feraheme    Past Medical History  Diagnosis Date  . Essential hypertension, benign   . IBS (irritable bowel syndrome)   . IC (interstitial cystitis)   . Allergic rhinitis   . Fibromyalgia   . Osteoarthritis   . Iron deficiency anemia 05/25/2012    Requiring IV Feraheme    Current Outpatient Prescriptions on File Prior to Visit  Medication Sig Dispense Refill  . acyclovir (ZOVIRAX) 400 MG tablet Take 400 mg by mouth 2 (two) times daily.        Marland Kitchen aspirin EC 325 MG tablet Take 81 mg by mouth daily.       . bisacodyl (BISACODYL) 5 MG EC tablet Take 5 mg by mouth daily as needed. For stool softener      . buPROPion (WELLBUTRIN XL) 300 MG 24 hr tablet Take 300 mg by mouth daily.      . cetirizine (ZYRTEC) 10 MG tablet Take 10 mg by  mouth at bedtime.       . Coenzyme Q10 (CO Q 10 PO) Take 1 tablet by mouth daily. Strength unknown      . diazepam (VALIUM) 5 MG tablet Take 5 mg by mouth 3 (three) times daily.      . Diclofenac Sodium (PENNSAID) 1.5 % SOLN Place onto the skin as needed.       . DULoxetine (CYMBALTA) 60 MG capsule Take 30 mg by mouth every evening.       Marland Kitchen EPINEPHrine (EPIPEN 2-PAK) 0.3 mg/0.3 mL DEVI Inject 0.3 mg into the muscle once. To food allergies, delayed reaction      . guaiFENesin (MUCINEX) 600 MG 12 hr tablet Take 600 mg by mouth as needed.       Marland Kitchen HYDROcodone-homatropine (HYCODAN) 5-1.5 MG/5ML syrup Take 5 mLs by mouth every 4 (four) hours as needed. For cough      . levalbuterol (XOPENEX HFA) 45 MCG/ACT inhaler Inhale 2 puffs into the lungs 3 (three) times daily.      Marland Kitchen levothyroxine (SYNTHROID, LEVOTHROID) 75 MCG tablet Take 75 mcg by mouth every morning.       . lidocaine  (XYLOCAINE) 5 % ointment Apply 1 application topically 3 (three) times daily as needed. For itching on bites      . Magnesium Hydroxide (PHILLIPS MILK OF MAGNESIA PO) Take 2 capsules by mouth at bedtime.      . meloxicam (MOBIC) 7.5 MG tablet Take 15 mg by mouth at bedtime.       . methylphenidate (RITALIN) 20 MG tablet Take 20 mg by mouth 3 (three) times daily. Takes 2 at 8 am and 1 at 10 am      . montelukast (SINGULAIR) 10 MG tablet Take 10 mg by mouth at bedtime.      . ondansetron (ZOFRAN-ODT) 8 MG disintegrating tablet Take 8 mg by mouth every 6 (six) hours as needed.      Marland Kitchen oxycodone (OXY-IR) 5 MG capsule Take 10 mg by mouth every 4 (four) hours as needed. For break through pain      . oxymorphone (OPANA ER) 20 MG 12 hr tablet Take 40 mg by mouth every 12 (twelve) hours.        . pentosan polysulfate (ELMIRON) 100 MG capsule Take 200 mg by mouth 2 (two) times daily.       . phenazopyridine (PYRIDIUM) 100 MG tablet Take 100 mg by mouth 3 (three) times daily as needed. For UTI pain      . potassium chloride (KLOR-CON) 10 MEQ CR tablet Take 10 mEq by mouth 4 (four) times daily. Patient takes up to 70-80 meq daily      . RABEprazole (ACIPHEX) 20 MG tablet Take 20 mg by mouth 2 (two) times daily.       . sodium chloride (OCEAN) 0.65 % nasal spray Place 1 spray into the nose as needed. For nasal decongestion      . thiamine 100 MG tablet Take 100 mg by mouth daily.        Marland Kitchen topiramate (TOPAMAX) 50 MG tablet Take 50 mg by mouth 2 (two) times daily.       Marland Kitchen triamcinolone (NASACORT) 55 MCG/ACT nasal inhaler Place 2 sprays into the nose as needed.      . triamterene-hydrochlorothiazide (MAXZIDE) 75-50 MG per tablet Take 1 tablet by mouth every morning.       . valsartan-hydrochlorothiazide (DIOVAN-HCT) 160-12.5 MG per tablet Take 1 tablet by mouth every morning.       Marland Kitchen  vitamin E 400 UNIT capsule Take 400 Units by mouth daily.        . clobetasol (TEMOVATE) 0.05 % cream Apply 1 application topically  daily as needed. In vaginal area       No current facility-administered medications on file prior to visit.   Past Surgical History  Procedure Laterality Date  . Spinal fusion  2010    C2-T2, done in [place]  . Vesicovaginal fistula closure w/ tah  1998  . Appendectomy  1974  . Breast surgery  1962    benign rumor  . Thyroidectomy, partial    . Vesico-vaginal fistula repair  1997  . Lumbar fusion      2011  . Anterior cervical decomp/discectomy fusion    . Colonoscopy with propofol  05/29/2012    Procedure: COLONOSCOPY WITH PROPOFOL;  Surgeon: Petra Kuba, MD;  Location: [facility];  Service: Endoscopy;  Laterality: N/A;  needs general  . Esophagogastroduodenoscopy (egd) with propofol  05/29/2012    Procedure: ESOPHAGOGASTRODUODENOSCOPY (EGD) WITH PROPOFOL;  Surgeon: Petra Kuba, MD;  Location: [facility];  Service: Endoscopy;  Laterality: N/A;   Allergies  Allergen Reactions  . Demerol Nausea And Vomiting  . Fluconazole     Can take brand name Diflucan not generic  . Iodine Other (See Comments)  . Shellfish-Derived Products Other (See Comments)  . Sulfa Drugs Cross Reactors Nausea And Vomiting    Fever, welts, hives, flu like  . Tape   . Tizanidine       Married, two children. One killed in an auto acc 2 yrs ago. One has fibromyalgia, and osteoarthritis Objective:   Physical Exam   Filed Vitals:   07/03/13 1528  BP: 146/80  Pulse: 88  Temp: 99.6 F (37.6 C)  Height: 4\' 10"  (1.473 m)  Weight: 155 lb (70.308 kg)  Alert and oriented. Skin warm and dry. Oral mucosa is moist.   . Sclera anicteric, conjunctivae is pink. Thyroid not enlarged. No cervical lymphadenopathy. Lungs clear. Heart regular rate and rhythm.  Abdomen is soft. Bowel sounds are positive. No hepatomegaly. No abdominal masses felt. No tenderness.  No edema to lower extremities.        Assessment:    Gastroparesis. Abnormal Emptying study. I discussed this case with Dr. Karilyn Cota. He also  interviewed patient.     Plan:    Domperidone 10mg  TID. Gastroparesis diet. OV in 1 months. Keep appt with Dr.Koch at [facility] for stomach motility studies.

## 2013-07-03 NOTE — Patient Instructions (Signed)
Domperidone 10mg  TID. Gastroparesis diet. OV in 3 months. Keep appt with DR. Alycia Rossetti at [facility].

## 2013-07-10 ENCOUNTER — Ambulatory Visit (HOSPITAL_COMMUNITY): Payer: Medicare PPO | Admitting: Oncology

## 2013-08-06 ENCOUNTER — Encounter (INDEPENDENT_AMBULATORY_CARE_PROVIDER_SITE_OTHER): Payer: Self-pay | Admitting: Internal Medicine

## 2013-08-06 ENCOUNTER — Ambulatory Visit (INDEPENDENT_AMBULATORY_CARE_PROVIDER_SITE_OTHER): Payer: 59 | Admitting: Internal Medicine

## 2013-08-06 VITALS — BP 130/76 | HR 112 | Temp 98.0°F | Ht <= 58 in | Wt 143.5 lb

## 2013-08-06 DIAGNOSIS — K3184 Gastroparesis: Secondary | ICD-10-CM

## 2013-08-06 NOTE — Patient Instructions (Signed)
OV in 6 months. Continue present medications 

## 2013-08-06 NOTE — Progress Notes (Signed)
Subjective:     Patient ID: Kristina Horton, female   DOB: 08/06/1945, 68 y.o.   MRN: [id number]  HPI Here today for f/u. Last seen in December for gastroparesis.  PCP Dr. Juanetta Gosling.  She has had N and V since July of 2014.  She has seen Dr. Darlen Round in the past for same (As recently as July of 2014).  She has taken erythromycin in the past for her gastroparesis, but really did not help. Her last visit she Domperidone 10mg  TID. She started July 21, 2013.  She tells me today she is feeling better.  She is eating 6-7 meals a day.  Her appetite is getting better. She has lost 1 1/2 pounds since her last visit in December. She usually has a BM every daily but not as much as she wants to. Has appet with Dr. Alycia Rossetti at [facility] (stomach motility specilist)  Married, One daughter deceased from [facility].    02/26/13 NM Gastric emptying study: N and V:   IMPRESSION:  Marked delay in gastric emptying with nearly all of the ingested  activity remaining in the stomach at 2 hours.  05/29/2012 Colonoscopy/EGD Dr. Ewing Schlein:  Colonscopy: Tiny hemorrhoids, otherwise normal to terminal ileum.  EGD: Tiny hiatal hernia, Minimal antritis. A few tiny gastric erosion. Otherwise normal EGD;.    Review of Systems see hpi Current Outpatient Prescriptions  Medication Sig Dispense Refill  . acyclovir (ZOVIRAX) 400 MG tablet Take 400 mg by mouth 2 (two) times daily.        Marland Kitchen ALPRAZolam (XANAX) 0.25 MG tablet Take 0.25 mg by mouth at bedtime as needed for anxiety.      Marland Kitchen aspirin EC 325 MG tablet Take 81 mg by mouth daily.       . bisacodyl (BISACODYL) 5 MG EC tablet Take 5 mg by mouth daily as needed. For stool softener      . buPROPion (WELLBUTRIN XL) 300 MG 24 hr tablet Take 300 mg by mouth daily.      . cetirizine (ZYRTEC) 10 MG tablet Take 10 mg by mouth at bedtime.       . ciprofloxacin (CIPRO) 500 MG tablet Take 500 mg by mouth 2 (two) times daily.      . clobetasol (TEMOVATE) 0.05 % cream Apply 1 application topically  daily as needed. In vaginal area      . Coenzyme Q10 (CO Q 10 PO) Take 1 tablet by mouth daily. Strength unknown      . Diclofenac Sodium (PENNSAID) 1.5 % SOLN Place onto the skin as needed.       . DULoxetine (CYMBALTA) 60 MG capsule Take 30 mg by mouth every evening.       Marland Kitchen EPINEPHrine (EPIPEN 2-PAK) 0.3 mg/0.3 mL DEVI Inject 0.3 mg into the muscle once. To food allergies, delayed reaction      . guaiFENesin (MUCINEX) 600 MG 12 hr tablet Take 600 mg by mouth as needed.       Marland Kitchen HYDROcodone-homatropine (HYCODAN) 5-1.5 MG/5ML syrup Take 5 mLs by mouth every 4 (four) hours as needed. For cough      . levalbuterol (XOPENEX HFA) 45 MCG/ACT inhaler Inhale 2 puffs into the lungs 3 (three) times daily.      Marland Kitchen levothyroxine (SYNTHROID, LEVOTHROID) 75 MCG tablet Take 75 mcg by mouth every morning.       . lidocaine (XYLOCAINE) 5 % ointment Apply 1 application topically 3 (three) times daily as needed. For itching on bites      .  Magnesium Hydroxide (PHILLIPS MILK OF MAGNESIA PO) Take 2 capsules by mouth at bedtime.      . meloxicam (MOBIC) 7.5 MG tablet Take 15 mg by mouth at bedtime.       . methylphenidate (RITALIN) 20 MG tablet Take 20 mg by mouth 3 (three) times daily. Takes 2 at 8 am and 1 at 10 am      . montelukast (SINGULAIR) 10 MG tablet Take 10 mg by mouth at bedtime.      . ondansetron (ZOFRAN-ODT) 8 MG disintegrating tablet Take 8 mg by mouth every 6 (six) hours as needed.      Marland Kitchen oxycodone (OXY-IR) 5 MG capsule Take 10 mg by mouth every 4 (four) hours as needed. For break through pain      . oxymorphone (OPANA ER) 20 MG 12 hr tablet Take 40 mg by mouth every 12 (twelve) hours.        . pentosan polysulfate (ELMIRON) 100 MG capsule Take 200 mg by mouth 2 (two) times daily.       . phenazopyridine (PYRIDIUM) 100 MG tablet Take 100 mg by mouth 3 (three) times daily as needed. For UTI pain      . potassium chloride (KLOR-CON) 10 MEQ CR tablet Take 10 mEq by mouth 4 (four) times daily. Patient  takes up to 70-80 meq daily      . RABEprazole (ACIPHEX) 20 MG tablet Take 20 mg by mouth 2 (two) times daily.       . sodium chloride (OCEAN) 0.65 % nasal spray Place 1 spray into the nose as needed. For nasal decongestion      . thiamine 100 MG tablet Take 100 mg by mouth daily.        Marland Kitchen topiramate (TOPAMAX) 50 MG tablet Take 50 mg by mouth 3 (three) times daily.       Marland Kitchen triamcinolone (NASACORT) 55 MCG/ACT nasal inhaler Place 2 sprays into the nose as needed.      . triamterene-hydrochlorothiazide (MAXZIDE) 75-50 MG per tablet Take 1 tablet by mouth every morning.       . valsartan-hydrochlorothiazide (DIOVAN-HCT) 160-12.5 MG per tablet Take 1 tablet by mouth every morning.       . vitamin E 400 UNIT capsule Take 400 Units by mouth daily.         No current facility-administered medications for this visit.   Past Medical History  Diagnosis Date  . Essential hypertension, benign   . IBS (irritable bowel syndrome)   . IC (interstitial cystitis)   . Allergic rhinitis   . Fibromyalgia   . Osteoarthritis   . Iron deficiency anemia 05/25/2012    Requiring IV Feraheme   . Gastroparesis     2014  . DJD (degenerative joint disease)    Past Surgical History  Procedure Laterality Date  . Spinal fusion  2010    C2-T2, done in [place]  . Vesicovaginal fistula closure w/ tah  1998  . Appendectomy  1974  . Breast surgery  1962    benign rumor  . Thyroidectomy, partial    . Vesico-vaginal fistula repair  1997  . Lumbar fusion      2011  . Anterior cervical decomp/discectomy fusion    . Colonoscopy with propofol  05/29/2012    Procedure: COLONOSCOPY WITH PROPOFOL;  Surgeon: Petra Kuba, MD;  Location: [facility];  Service: Endoscopy;  Laterality: N/A;  needs general  . Esophagogastroduodenoscopy (egd) with propofol  05/29/2012    Procedure: ESOPHAGOGASTRODUODENOSCOPY (  EGD) WITH PROPOFOL;  Surgeon: Petra KubaMarc E Magod, MD;  Location: [facility];  Service: Endoscopy;  Laterality: N/A;    Allergies  Allergen Reactions  . Demerol Nausea And Vomiting  . Fluconazole     Can take brand name Diflucan not generic  . Iodine Other (See Comments)  . Shellfish-Derived Products Other (See Comments)  . Sulfa Drugs Cross Reactors Nausea And Vomiting    Fever, welts, hives, flu like  . Tape   . Tizanidine         Objective:   Physical Exam  Filed Vitals:   08/06/13 1550  BP: 130/76  Pulse: 112  Temp: 98 F (36.7 C)  Height: 4\' 10"  (1.473 m)  Weight: 143 lb 8 oz (65.091 kg)   Alert and oriented. Skin warm and dry. Oral mucosa is moist.   . Sclera anicteric, conjunctivae is pink. Thyroid not enlarged. No cervical lymphadenopathy. Lungs clear. Heart regular rate and rhythm.  Abdomen is soft. Bowel sounds are positive. No hepatomegaly. No abdominal masses felt. No tenderness.  No edema to lower extremities.       Assessment:   Gastroparesis. She says she is gradually getting better. She has been on Domperidone a bout 2 1/2 weeks.      Plan:    Low fat diet. Small meals x 6 a day. [facility] appt with [facility]. OV in 6 months with Dr. Karilyn Cotaehman.

## 2013-08-16 ENCOUNTER — Telehealth (INDEPENDENT_AMBULATORY_CARE_PROVIDER_SITE_OTHER): Payer: Self-pay | Admitting: *Deleted

## 2013-08-16 NOTE — Telephone Encounter (Signed)
Kristina GamesJaneice has finished the prednisone would like to know if she should try the domperidone 1 tablet daily (if so,what time a day) or what does Dr. Karilyn Cotaehman suggest. She is sick/nauseated feeling again. Would he prescribe a tropical cream for the rash? Her return phone number is [phone].

## 2013-08-16 NOTE — Telephone Encounter (Signed)
Forwarded to Dr.Rehman. 

## 2013-08-16 NOTE — Telephone Encounter (Signed)
Forwarded to Dr.Rehman to address. 

## 2013-08-16 NOTE — Telephone Encounter (Signed)
She can try domperidone 10 mg once daily for breakfast. If her rash gets worse she will need to be seen in the office. Please tell her that she should keep appointment with [facility] at [facility].

## 2013-08-17 NOTE — Telephone Encounter (Signed)
Spoke with Agapito GamesJaneice and Dr. Patty Sermonsehman's recommendations were given to her. Voices understood.

## 2013-08-20 ENCOUNTER — Telehealth (INDEPENDENT_AMBULATORY_CARE_PROVIDER_SITE_OTHER): Payer: Self-pay | Admitting: Internal Medicine

## 2013-08-20 DIAGNOSIS — R21 Rash and other nonspecific skin eruption: Secondary | ICD-10-CM

## 2013-08-20 MED ORDER — HYDROXYZINE PAMOATE 25 MG PO CAPS: 25.0000 mg | ORAL_CAPSULE | Freq: Three times a day (TID) | ORAL | Status: DC | PRN

## 2013-08-20 NOTE — Telephone Encounter (Signed)
Opened in error

## 2013-08-22 ENCOUNTER — Telehealth (INDEPENDENT_AMBULATORY_CARE_PROVIDER_SITE_OTHER): Payer: Self-pay | Admitting: *Deleted

## 2013-08-22 ENCOUNTER — Other Ambulatory Visit (INDEPENDENT_AMBULATORY_CARE_PROVIDER_SITE_OTHER): Payer: Self-pay | Admitting: Internal Medicine

## 2013-08-22 NOTE — Telephone Encounter (Signed)
Kristina Horton from [facility] called and states that the patient will not accept the prescription for the Hydroxyzine , (Vistaril) that was called escribed on 02-09/15. She is requesting Atarax 25 mg and tablet form. Dr.Rehman was called and made aware and he states that we can call in Atarax 25 mg - take 1 by mouth 3 times daily as needed for itching - #30  And no refills. This [facility]. Per Dr.Rehman the patient is to be brought in to see me next week , Kristina LeashDonna was made aware. Patient was called and made aware.

## 2013-08-27 ENCOUNTER — Ambulatory Visit (INDEPENDENT_AMBULATORY_CARE_PROVIDER_SITE_OTHER): Payer: Medicare PPO | Admitting: Internal Medicine

## 2013-09-11 ENCOUNTER — Telehealth (INDEPENDENT_AMBULATORY_CARE_PROVIDER_SITE_OTHER): Payer: Self-pay | Admitting: *Deleted

## 2013-09-11 NOTE — Telephone Encounter (Signed)
Spoke with Kristina Horton and she has dicided to no longer be a patient at [facility]. She has [facility].  Patient's insurance has been updated and the billing office has been called at [phone] to have DOS 08/06/13 resubmitted to the correct insurance. Patient advised and voices understood.

## 2013-10-19 ENCOUNTER — Ambulatory Visit (HOSPITAL_COMMUNITY)
Admission: RE | Admit: 2013-10-19 | Discharge: 2013-10-19 | Disposition: A | Discharge status: Home / Self Care | Payer: Medicare Other | Source: Ambulatory Visit | Attending: Pulmonary Disease | Admitting: Pulmonary Disease

## 2013-10-19 ENCOUNTER — Other Ambulatory Visit (HOSPITAL_COMMUNITY): Payer: Self-pay | Admitting: Pulmonary Disease

## 2013-10-19 DIAGNOSIS — M5137 Other intervertebral disc degeneration, lumbosacral region: Secondary | ICD-10-CM | POA: Insufficient documentation

## 2013-10-19 DIAGNOSIS — M545 Low back pain, unspecified: Secondary | ICD-10-CM

## 2013-10-19 DIAGNOSIS — M51379 Other intervertebral disc degeneration, lumbosacral region without mention of lumbar back pain or lower extremity pain: Secondary | ICD-10-CM | POA: Insufficient documentation

## 2013-10-19 DIAGNOSIS — Z981 Arthrodesis status: Secondary | ICD-10-CM | POA: Insufficient documentation

## 2013-10-19 DIAGNOSIS — IMO0002 Reserved for concepts with insufficient information to code with codable children: Secondary | ICD-10-CM | POA: Insufficient documentation

## 2013-10-26 ENCOUNTER — Other Ambulatory Visit: Payer: Self-pay | Admitting: Pulmonary Disease

## 2013-10-26 DIAGNOSIS — M545 Low back pain, unspecified: Secondary | ICD-10-CM

## 2013-10-26 DIAGNOSIS — IMO0002 Reserved for concepts with insufficient information to code with codable children: Secondary | ICD-10-CM

## 2013-10-28 ENCOUNTER — Ambulatory Visit
Admission: RE | Admit: 2013-10-28 | Discharge: 2013-10-28 | Disposition: A | Discharge status: Home / Self Care | Payer: Medicare Other | Source: Ambulatory Visit | Attending: Pulmonary Disease | Admitting: Pulmonary Disease

## 2013-10-28 DIAGNOSIS — IMO0002 Reserved for concepts with insufficient information to code with codable children: Secondary | ICD-10-CM

## 2013-10-28 DIAGNOSIS — M545 Low back pain, unspecified: Secondary | ICD-10-CM

## 2013-11-13 ENCOUNTER — Ambulatory Visit (INDEPENDENT_AMBULATORY_CARE_PROVIDER_SITE_OTHER): Payer: Medicare PPO | Admitting: Internal Medicine

## 2014-03-06 ENCOUNTER — Other Ambulatory Visit (HOSPITAL_COMMUNITY): Payer: Self-pay | Admitting: Pulmonary Disease

## 2014-03-06 ENCOUNTER — Ambulatory Visit (HOSPITAL_COMMUNITY)
Admission: RE | Admit: 2014-03-06 | Discharge: 2014-03-06 | Disposition: A | Discharge status: Home / Self Care | Payer: Medicare Other | Source: Ambulatory Visit | Attending: Pulmonary Disease | Admitting: Pulmonary Disease

## 2014-03-06 DIAGNOSIS — IMO0002 Reserved for concepts with insufficient information to code with codable children: Secondary | ICD-10-CM | POA: Insufficient documentation

## 2014-03-06 DIAGNOSIS — M545 Low back pain, unspecified: Secondary | ICD-10-CM | POA: Diagnosis present

## 2014-03-06 DIAGNOSIS — Z1231 Encounter for screening mammogram for malignant neoplasm of breast: Secondary | ICD-10-CM

## 2014-03-11 ENCOUNTER — Ambulatory Visit (HOSPITAL_COMMUNITY): Payer: Medicare Other

## 2014-03-25 ENCOUNTER — Other Ambulatory Visit: Payer: Self-pay | Admitting: Neurosurgery

## 2014-03-25 ENCOUNTER — Ambulatory Visit (HOSPITAL_COMMUNITY): Payer: Medicare Other

## 2014-03-25 DIAGNOSIS — M5136 Other intervertebral disc degeneration, lumbar region: Secondary | ICD-10-CM

## 2014-03-28 ENCOUNTER — Ambulatory Visit (HOSPITAL_COMMUNITY)
Admission: RE | Admit: 2014-03-28 | Discharge: 2014-03-28 | Disposition: A | Discharge status: Home / Self Care | Payer: Medicare Other | Source: Ambulatory Visit | Attending: Pulmonary Disease | Admitting: Pulmonary Disease

## 2014-03-28 DIAGNOSIS — Z1231 Encounter for screening mammogram for malignant neoplasm of breast: Secondary | ICD-10-CM | POA: Diagnosis not present

## 2014-04-03 ENCOUNTER — Ambulatory Visit
Admission: RE | Admit: 2014-04-03 | Discharge: 2014-04-03 | Disposition: A | Discharge status: Home / Self Care | Payer: Medicare Other | Source: Ambulatory Visit | Attending: Neurosurgery | Admitting: Neurosurgery

## 2014-04-03 DIAGNOSIS — M5136 Other intervertebral disc degeneration, lumbar region: Secondary | ICD-10-CM

## 2014-04-03 MED ORDER — GADOBENATE DIMEGLUMINE 529 MG/ML IV SOLN
15.0000 mL | Freq: Once | INTRAVENOUS | Status: AC | PRN
  Administered 2014-04-03: 15 mL via INTRAVENOUS

## 2014-04-24 ENCOUNTER — Emergency Department (HOSPITAL_COMMUNITY): Payer: Medicare Other

## 2014-04-24 ENCOUNTER — Emergency Department (HOSPITAL_COMMUNITY)
Admission: EM | Admit: 2014-04-24 | Discharge: 2014-04-24 | Disposition: A | Discharge status: Home / Self Care | Payer: Medicare Other | Attending: Emergency Medicine | Admitting: Emergency Medicine

## 2014-04-24 ENCOUNTER — Encounter (HOSPITAL_COMMUNITY): Payer: Self-pay | Admitting: Emergency Medicine

## 2014-04-24 DIAGNOSIS — Z87891 Personal history of nicotine dependence: Secondary | ICD-10-CM | POA: Insufficient documentation

## 2014-04-24 DIAGNOSIS — Z791 Long term (current) use of non-steroidal anti-inflammatories (NSAID): Secondary | ICD-10-CM | POA: Insufficient documentation

## 2014-04-24 DIAGNOSIS — Z792 Long term (current) use of antibiotics: Secondary | ICD-10-CM | POA: Diagnosis not present

## 2014-04-24 DIAGNOSIS — Z91040 Latex allergy status: Secondary | ICD-10-CM | POA: Insufficient documentation

## 2014-04-24 DIAGNOSIS — Z7982 Long term (current) use of aspirin: Secondary | ICD-10-CM | POA: Insufficient documentation

## 2014-04-24 DIAGNOSIS — M1990 Unspecified osteoarthritis, unspecified site: Secondary | ICD-10-CM | POA: Diagnosis not present

## 2014-04-24 DIAGNOSIS — M545 Low back pain, unspecified: Secondary | ICD-10-CM

## 2014-04-24 DIAGNOSIS — Z79899 Other long term (current) drug therapy: Secondary | ICD-10-CM | POA: Diagnosis not present

## 2014-04-24 DIAGNOSIS — E669 Obesity, unspecified: Secondary | ICD-10-CM | POA: Diagnosis not present

## 2014-04-24 DIAGNOSIS — Z8719 Personal history of other diseases of the digestive system: Secondary | ICD-10-CM | POA: Diagnosis not present

## 2014-04-24 DIAGNOSIS — F329 Major depressive disorder, single episode, unspecified: Secondary | ICD-10-CM | POA: Diagnosis not present

## 2014-04-24 DIAGNOSIS — I10 Essential (primary) hypertension: Secondary | ICD-10-CM | POA: Insufficient documentation

## 2014-04-24 DIAGNOSIS — Z862 Personal history of diseases of the blood and blood-forming organs and certain disorders involving the immune mechanism: Secondary | ICD-10-CM | POA: Diagnosis not present

## 2014-04-24 DIAGNOSIS — M549 Dorsalgia, unspecified: Secondary | ICD-10-CM | POA: Diagnosis present

## 2014-04-24 HISTORY — DX: Major depressive disorder, single episode, unspecified: F32.9

## 2014-04-24 HISTORY — DX: Depression, unspecified: F32.A

## 2014-04-24 MED ORDER — FENTANYL CITRATE 0.05 MG/ML IJ SOLN
100.0000 ug | Freq: Once | INTRAMUSCULAR | Status: AC
  Administered 2014-04-24: 100 ug via INTRAVENOUS
  Filled 2014-04-24: qty 2

## 2014-04-24 MED ORDER — OXYCODONE HCL 5 MG PO TABS
20.0000 mg | ORAL_TABLET | Freq: Once | ORAL | Status: AC
  Administered 2014-04-24: 20 mg via ORAL
  Filled 2014-04-24: qty 4

## 2014-04-24 NOTE — ED Provider Notes (Signed)
CSN: [id number]     Arrival date & time 04/24/14  1820 History   First MD Initiated Contact with Patient 04/24/14 1903     Chief Complaint  Patient presents with  . Back Pain     (Consider location/radiation/quality/duration/timing/severity/associated sxs/prior Treatment) Patient is a 68 y.o. female presenting with back pain. The history is provided by the patient.  Back Pain Location:  Lumbar spine Associated symptoms: no abdominal pain, no chest pain, no numbness and no weakness    patient with acute on chronic back pain. Is on chronic pain medicine for her lumbar pain. She's had pain worsening over the last few weeks. She has an appointment tomorrow with a second opinion from a [facility]surgeon [facility]. She states today she was bending over doing some laundry and felt a pop in her back. She then developed severe pain in her back. No numbness or weakness. The pain is worse with movement. It does not go down either leg. No loss of bladder or bowel control. No fevers. The pain is not controlled with her pain medicine at home. Past Medical History  Diagnosis Date  . Essential hypertension, benign   . IBS (irritable bowel syndrome)   . IC (interstitial cystitis)   . Allergic rhinitis   . Fibromyalgia   . Osteoarthritis   . Iron deficiency anemia 05/25/2012    Requiring IV Feraheme   . Gastroparesis     2014  . DJD (degenerative joint disease)   . Depression    Past Surgical History  Procedure Laterality Date  . Spinal fusion  2010    C2-T2, done in [place]  . Vesicovaginal fistula closure w/ tah  1998  . Appendectomy  1974  . Breast surgery  1962    benign rumor  . Thyroidectomy, partial    . Vesico-vaginal fistula repair  1997  . Lumbar fusion      2011  . Anterior cervical decomp/discectomy fusion    . Colonoscopy with propofol  05/29/2012    Procedure: COLONOSCOPY WITH PROPOFOL;  Surgeon: Petra Kuba, MD;  Location: [facility];  Service: Endoscopy;  Laterality: N/A;   needs general  . Esophagogastroduodenoscopy (egd) with propofol  05/29/2012    Procedure: ESOPHAGOGASTRODUODENOSCOPY (EGD) WITH PROPOFOL;  Surgeon: Petra Kuba, MD;  Location: [facility];  Service: Endoscopy;  Laterality: N/A;   Family History  Problem Relation Age of Onset  . COPD Mother   . Heart failure Mother   . Alcohol abuse Mother   . Diabetes Mother   . Heart failure Father   . Alcohol abuse Brother    History  Substance Use Topics  . Smoking status: Former Smoker -- 3.00 packs/day for 18 years    Types: Cigarettes    Quit date: 07/12/1978  . Smokeless tobacco: Never Used  . Alcohol Use: No   OB History   Grav Para Term Preterm Abortions TAB SAB Ect Mult Living                 Review of Systems  Constitutional: Negative for chills.  Respiratory: Negative for shortness of breath.   Cardiovascular: Negative for chest pain.  Gastrointestinal: Negative for abdominal pain.  Musculoskeletal: Positive for back pain.  Neurological: Negative for weakness and numbness.      Allergies  Demerol; Fluconazole; Iodine; Latex; Shellfish-derived products; Sulfa drugs cross reactors; Tape; and Tizanidine  Home Medications   Prior to Admission medications   Medication Sig Start Date End Date Taking? Authorizing Provider  acyclovir (ZOVIRAX) 400 MG tablet Take 400 mg by mouth 2 (two) times daily.      Historical Provider, MD  ALPRAZolam Prudy Feeler(XANAX) 0.25 MG tablet Take 0.25 mg by mouth at bedtime as needed for anxiety.    Historical Provider, MD  aspirin EC 325 MG tablet Take 81 mg by mouth daily.     Historical Provider, MD  bisacodyl (BISACODYL) 5 MG EC tablet Take 5 mg by mouth daily as needed. For stool softener    Historical Provider, MD  buPROPion (WELLBUTRIN XL) 300 MG 24 hr tablet Take 300 mg by mouth daily.    Historical Provider, MD  cetirizine (ZYRTEC) 10 MG tablet Take 10 mg by mouth at bedtime.  10/11/11   Storm FriskPatrick E Wright, MD  ciprofloxacin (CIPRO) 500 MG tablet Take  500 mg by mouth 2 (two) times daily.    Historical Provider, MD  clobetasol (TEMOVATE) 0.05 % cream Apply 1 application topically daily as needed. In vaginal area    Historical Provider, MD  Coenzyme Q10 (CO Q 10 PO) Take 1 tablet by mouth daily. Strength unknown    Historical Provider, MD  Diclofenac Sodium (PENNSAID) 1.5 % SOLN Place onto the skin as needed.     Historical Provider, MD  DULoxetine (CYMBALTA) 60 MG capsule Take 30 mg by mouth every evening.     Historical Provider, MD  EPINEPHrine (EPIPEN 2-PAK) 0.3 mg/0.3 mL DEVI Inject 0.3 mg into the muscle once. To food allergies, delayed reaction    Historical Provider, MD  guaiFENesin (MUCINEX) 600 MG 12 hr tablet Take 600 mg by mouth as needed.     Historical Provider, MD  HYDROcodone-homatropine (HYCODAN) 5-1.5 MG/5ML syrup Take 5 mLs by mouth every 4 (four) hours as needed. For cough    Historical Provider, MD  hydrOXYzine (VISTARIL) 25 MG capsule Take 1 capsule (25 mg total) by mouth 3 (three) times daily as needed. 08/20/13   Len Blalockerri L Setzer, NP  levalbuterol (XOPENEX HFA) 45 MCG/ACT inhaler Inhale 2 puffs into the lungs 3 (three) times daily.    Historical Provider, MD  levothyroxine (SYNTHROID, LEVOTHROID) 75 MCG tablet Take 75 mcg by mouth every morning.     Historical Provider, MD  lidocaine (XYLOCAINE) 5 % ointment Apply 1 application topically 3 (three) times daily as needed. For itching on bites    Historical Provider, MD  Magnesium Hydroxide (PHILLIPS MILK OF MAGNESIA PO) Take 2 capsules by mouth at bedtime.    Historical Provider, MD  meloxicam (MOBIC) 7.5 MG tablet Take 15 mg by mouth at bedtime.     Historical Provider, MD  methylphenidate (RITALIN) 20 MG tablet Take 20 mg by mouth 3 (three) times daily. Takes 2 at 8 am and 1 at 10 am    Historical Provider, MD  montelukast (SINGULAIR) 10 MG tablet Take 10 mg by mouth at bedtime.    Historical Provider, MD  ondansetron (ZOFRAN-ODT) 8 MG disintegrating tablet Take 8 mg by mouth  every 6 (six) hours as needed.    Historical Provider, MD  oxycodone (OXY-IR) 5 MG capsule Take 10 mg by mouth every 4 (four) hours as needed. For break through pain    Historical Provider, MD  oxymorphone (OPANA ER) 20 MG 12 hr tablet Take 40 mg by mouth every 12 (twelve) hours.      Historical Provider, MD  pentosan polysulfate (ELMIRON) 100 MG capsule Take 200 mg by mouth 2 (two) times daily.     Historical Provider, MD  phenazopyridine (PYRIDIUM)  100 MG tablet Take 100 mg by mouth 3 (three) times daily as needed. For UTI pain    Historical Provider, MD  potassium chloride (KLOR-CON) 10 MEQ CR tablet Take 10 mEq by mouth 4 (four) times daily. Patient takes up to 70-80 meq daily    Historical Provider, MD  RABEprazole (ACIPHEX) 20 MG tablet Take 20 mg by mouth 2 (two) times daily.     Historical Provider, MD  sodium chloride (OCEAN) 0.65 % nasal spray Place 1 spray into the nose as needed. For nasal decongestion    Historical Provider, MD  thiamine 100 MG tablet Take 100 mg by mouth daily.      Historical Provider, MD  topiramate (TOPAMAX) 50 MG tablet Take 50 mg by mouth 3 (three) times daily.     Historical Provider, MD  triamcinolone (NASACORT) 55 MCG/ACT nasal inhaler Place 2 sprays into the nose as needed. 05/12/12   Leslye Peerobert S Byrum, MD  triamterene-hydrochlorothiazide (MAXZIDE) 75-50 MG per tablet Take 1 tablet by mouth every morning.     Historical Provider, MD  valsartan-hydrochlorothiazide (DIOVAN-HCT) 160-12.5 MG per tablet Take 1 tablet by mouth every morning.     Historical Provider, MD  vitamin E 400 UNIT capsule Take 400 Units by mouth daily.      Historical Provider, MD   BP 119/56  Pulse 75  Temp(Src) 98.6 F (37 C) (Oral)  Resp 18  SpO2 96% Physical Exam  Constitutional: She is oriented to person, place, and time. She appears well-developed and well-nourished.  Patient is obese  Cardiovascular: Normal rate.   Pulmonary/Chest: Effort normal.  Abdominal: Soft. There is no  tenderness.  Musculoskeletal: She exhibits tenderness.  Lumbar tenderness. No step-off deformity.  Neurological: She is alert and oriented to person, place, and time.  Good flexion and extension at knees bilaterally. Good flexion and extension ankle bilaterally. Peroneal sensation intact  Skin: Skin is warm.    ED Course  Procedures (including critical care time) Labs Review Labs Reviewed - No data to display  Imaging Review Dg Lumbar Spine Complete  04/24/2014   CLINICAL DATA:  Acute low back pain for 1 day. Chronic low back pain for 30 years.  EXAM: LUMBAR SPINE - COMPLETE 4+ VIEW  COMPARISON:  MRI lumbar spine 04/03/2014  FINDINGS: There are 5 nonrib bearing lumbar-type vertebral bodies. There is a chronic compression fracture of the T12 vertebral body similar to the prior exam of 04/03/2014. The alignment is anatomic. There is no spondylolysis. There is no acute fracture . There is stable grade 1 anterolisthesis of L4 on L5. There is posterior lumbar interbody fusion from L3 through S1 without failure or complication. There is severe degenerative disc disease of the adjacent L1-2 and L2-3 disc level.  The SI joints are unremarkable.  IMPRESSION: 1. Posterior lumbar interbody fusion from L3 through S1. 2. No acute osseous injury of the lumbar spine. 3. Advanced degenerative disc disease at L1-2 and L2-3.   Electronically Signed   By: Elige KoHetal  Patel   On: 04/24/2014 20:36     EKG Interpretation None      MDM   Final diagnoses:  Midline low back pain without sciatica    Patient with back pain. History same. Has followup tomorrow. No retroflex. Pain feels better after IV narcotics. Also given a dose of oral medication patient be discharged home    Juliet Rudeathan R. Rubin PayorPickering, MD 04/24/14 2223

## 2014-04-24 NOTE — ED Notes (Signed)
XRAY called to inform we need a disc of the pt's XRAY.

## 2014-04-24 NOTE — Discharge Instructions (Signed)

## 2014-04-24 NOTE — ED Notes (Signed)
Reports hx of back pain, pt bent over this morning and felt something pop and having severe lower back pain. Pt has appt with neurosurgeon tomorrow but unable to wait due to pain. Pt reports it feels like compression fracture. No acute distress noted at triage.

## 2014-05-09 ENCOUNTER — Other Ambulatory Visit: Payer: Self-pay | Admitting: Pulmonary Disease

## 2014-05-09 DIAGNOSIS — M545 Low back pain, unspecified: Secondary | ICD-10-CM

## 2014-05-09 DIAGNOSIS — G8929 Other chronic pain: Secondary | ICD-10-CM

## 2014-05-16 NOTE — Progress Notes (Signed)
Called 13 hour prep to [facility] at [phone] opt. #2.

## 2014-05-22 ENCOUNTER — Other Ambulatory Visit: Payer: Medicare Other

## 2014-05-22 ENCOUNTER — Ambulatory Visit
Admission: RE | Admit: 2014-05-22 | Discharge: 2014-05-22 | Disposition: A | Discharge status: Home / Self Care | Payer: Medicare Other | Source: Ambulatory Visit | Attending: Pulmonary Disease | Admitting: Pulmonary Disease

## 2014-05-22 DIAGNOSIS — G8929 Other chronic pain: Secondary | ICD-10-CM

## 2014-05-22 DIAGNOSIS — M545 Low back pain: Principal | ICD-10-CM

## 2014-05-22 MED ORDER — IOHEXOL 180 MG/ML  SOLN: 1.0000 mL | Freq: Once | INTRAMUSCULAR | Status: AC | PRN

## 2014-05-22 MED ORDER — METHYLPREDNISOLONE ACETATE 40 MG/ML INJ SUSP (RADIOLOG: 120.0000 mg | Freq: Once | INTRAMUSCULAR | Status: DC

## 2014-05-25 ENCOUNTER — Encounter (HOSPITAL_COMMUNITY): Payer: Self-pay | Admitting: Cardiology

## 2014-05-25 ENCOUNTER — Emergency Department (HOSPITAL_COMMUNITY)
Admission: EM | Admit: 2014-05-25 | Discharge: 2014-05-25 | Disposition: A | Discharge status: Home / Self Care | Payer: No Typology Code available for payment source | Attending: Emergency Medicine | Admitting: Emergency Medicine

## 2014-05-25 ENCOUNTER — Emergency Department (HOSPITAL_COMMUNITY): Payer: No Typology Code available for payment source

## 2014-05-25 DIAGNOSIS — Y9389 Activity, other specified: Secondary | ICD-10-CM | POA: Insufficient documentation

## 2014-05-25 DIAGNOSIS — Z79899 Other long term (current) drug therapy: Secondary | ICD-10-CM | POA: Diagnosis not present

## 2014-05-25 DIAGNOSIS — Y92410 Unspecified street and highway as the place of occurrence of the external cause: Secondary | ICD-10-CM | POA: Diagnosis not present

## 2014-05-25 DIAGNOSIS — Z87891 Personal history of nicotine dependence: Secondary | ICD-10-CM | POA: Diagnosis not present

## 2014-05-25 DIAGNOSIS — M797 Fibromyalgia: Secondary | ICD-10-CM | POA: Insufficient documentation

## 2014-05-25 DIAGNOSIS — Z91040 Latex allergy status: Secondary | ICD-10-CM | POA: Diagnosis not present

## 2014-05-25 DIAGNOSIS — K3184 Gastroparesis: Secondary | ICD-10-CM | POA: Diagnosis not present

## 2014-05-25 DIAGNOSIS — Z7982 Long term (current) use of aspirin: Secondary | ICD-10-CM | POA: Diagnosis not present

## 2014-05-25 DIAGNOSIS — F329 Major depressive disorder, single episode, unspecified: Secondary | ICD-10-CM | POA: Diagnosis not present

## 2014-05-25 DIAGNOSIS — I10 Essential (primary) hypertension: Secondary | ICD-10-CM | POA: Diagnosis not present

## 2014-05-25 DIAGNOSIS — J309 Allergic rhinitis, unspecified: Secondary | ICD-10-CM | POA: Insufficient documentation

## 2014-05-25 DIAGNOSIS — Z792 Long term (current) use of antibiotics: Secondary | ICD-10-CM | POA: Insufficient documentation

## 2014-05-25 DIAGNOSIS — Z7901 Long term (current) use of anticoagulants: Secondary | ICD-10-CM | POA: Diagnosis not present

## 2014-05-25 DIAGNOSIS — S4991XA Unspecified injury of right shoulder and upper arm, initial encounter: Secondary | ICD-10-CM | POA: Diagnosis not present

## 2014-05-25 DIAGNOSIS — Z862 Personal history of diseases of the blood and blood-forming organs and certain disorders involving the immune mechanism: Secondary | ICD-10-CM | POA: Insufficient documentation

## 2014-05-25 DIAGNOSIS — Y998 Other external cause status: Secondary | ICD-10-CM | POA: Insufficient documentation

## 2014-05-25 MED ORDER — FENTANYL CITRATE 0.05 MG/ML IJ SOLN
100.0000 ug | Freq: Once | INTRAMUSCULAR | Status: AC
  Administered 2014-05-25: 100 ug via INTRAMUSCULAR
  Filled 2014-05-25: qty 2

## 2014-05-25 NOTE — Discharge Instructions (Signed)
Shoulder Sprain °A shoulder sprain is the result of damage to the tough, fiber-like tissues (ligaments) that help hold your shoulder in place. The ligaments may be stretched or torn. Besides the main shoulder joint (the ball and socket), there are several smaller joints that connect the bones in this area. A sprain usually involves one of those joints. Most often it is the acromioclavicular (or AC) joint. That is the joint that connects the collarbone (clavicle) and the shoulder blade (scapula) at the top point of the shoulder blade (acromion). °A shoulder sprain is a mild form of what is called a shoulder separation. Recovering from a shoulder sprain may take some time. For some, pain lingers for several months. Most people recover without long term problems. °CAUSES  °· A shoulder sprain is usually caused by some kind of trauma. This might be: °¨ Falling on an outstretched arm. °¨ Being hit hard on the shoulder. °¨ Twisting the arm. °· Shoulder sprains are more likely to occur in people who: °¨ Play sports. °¨ Have balance or coordination problems. °SYMPTOMS  °· Pain when you move your shoulder. °· Limited ability to move the shoulder. °· Swelling and tenderness on top of the shoulder. °· Redness or warmth in the shoulder. °· Bruising. °· A change in the shape of the shoulder. °DIAGNOSIS  °Your healthcare provider may: °· Ask about your symptoms. °· Ask about recent activity that might have caused those symptoms. °· Examine your shoulder. You may be asked to do simple exercises to test movement. The other shoulder will be examined for comparison. °· Order some tests that provide a look inside the body. They can show the extent of the injury. The tests could include: °¨ X-rays. °¨ CT (computed tomography) scan. °¨ MRI (magnetic resonance imaging) scan. °RISKS AND COMPLICATIONS °· Loss of full shoulder motion. °· Ongoing shoulder pain. °TREATMENT  °How long it takes to recover from a shoulder sprain depends on how  severe it was. Treatment options may include: °· Rest. You should not use the arm or shoulder until it heals. °· Ice. For 2 or 3 days after the injury, put an ice pack on the shoulder up to 4 times a day. It should stay on for 15 to 20 minutes each time. Wrap the ice in a towel so it does not touch your skin. °· Over-the-counter medicine to relieve pain. °· A sling or brace. This will keep the arm still while the shoulder is healing. °· Physical therapy or rehabilitation exercises. These will help you regain strength and motion. Ask your healthcare provider when it is OK to begin these exercises. °· Surgery. The need for surgery is rare with a sprained shoulder, but some people may need surgery to keep the joint in place and reduce pain. °HOME CARE INSTRUCTIONS  °· Ask your healthcare provider about what you should and should not do while your shoulder heals. °· Make sure you know how to apply ice to the correct area of your shoulder. °· Talk with your healthcare provider about which medications should be used for pain and swelling. °· If rehabilitation therapy will be needed, ask your healthcare provider to refer you to a therapist. If it is not recommended, then ask about at-home exercises. Find out when exercise should begin. °SEEK MEDICAL CARE IF:  °Your pain, swelling, or redness at the joint increases. °SEEK IMMEDIATE MEDICAL CARE IF:  °· You have a fever. °· You cannot move your arm or shoulder. °Document Released: 11/14/2008 Document   Revised: 09/20/2011 Document Reviewed: 11/14/2008 °[employer]® Patient Information ©2015 [employer]. This information is not intended to replace advice given to you by your health care provider. Make sure you discuss any questions you have with your health care provider. ° °
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
## 2014-07-10 ENCOUNTER — Ambulatory Visit (HOSPITAL_COMMUNITY)
Admission: RE | Admit: 2014-07-10 | Discharge: 2014-07-10 | Disposition: A | Discharge status: Home / Self Care | Payer: Medicare Other | Source: Ambulatory Visit | Attending: Pulmonary Disease | Admitting: Pulmonary Disease

## 2014-07-10 ENCOUNTER — Other Ambulatory Visit (HOSPITAL_COMMUNITY): Payer: Self-pay | Admitting: Pulmonary Disease

## 2014-07-10 ENCOUNTER — Encounter (INDEPENDENT_AMBULATORY_CARE_PROVIDER_SITE_OTHER): Payer: Self-pay

## 2014-07-10 DIAGNOSIS — R0602 Shortness of breath: Secondary | ICD-10-CM | POA: Insufficient documentation

## 2014-07-10 DIAGNOSIS — R509 Fever, unspecified: Secondary | ICD-10-CM | POA: Diagnosis not present

## 2014-07-10 DIAGNOSIS — R05 Cough: Secondary | ICD-10-CM | POA: Insufficient documentation

## 2014-07-10 DIAGNOSIS — I700 Atherosclerosis of aorta: Secondary | ICD-10-CM | POA: Diagnosis not present

## 2014-07-10 DIAGNOSIS — R059 Cough, unspecified: Secondary | ICD-10-CM

## 2014-08-09 ENCOUNTER — Other Ambulatory Visit (HOSPITAL_COMMUNITY): Payer: Self-pay | Admitting: Neurosurgery

## 2014-08-09 ENCOUNTER — Ambulatory Visit (HOSPITAL_COMMUNITY)
Admission: RE | Admit: 2014-08-09 | Discharge: 2014-08-09 | Disposition: A | Discharge status: Home / Self Care | Payer: Medicare PPO | Source: Ambulatory Visit | Attending: Neurosurgery | Admitting: Neurosurgery

## 2014-08-09 DIAGNOSIS — Z9889 Other specified postprocedural states: Secondary | ICD-10-CM

## 2014-10-01 ENCOUNTER — Telehealth: Payer: Self-pay | Admitting: Licensed Clinical Social Worker

## 2014-10-01 NOTE — Patient Outreach (Signed)
CSW received phone call from client on 10/01/14.  Client was talking with CSW about insurance issues of concern to client. Client said she previously had insurance coverage with [facility]. Client asked for phone number for [facility] liason (CSW had previously provided this phone number for client). Client had to end call. CSW to research phone number for client. Later, CSW called client number again on 10/01/14, spoke with spouse of client via phone. CSW  verified identity of spouse of client.  CSW gave spouse of client the following information on 10/01/14 that had been requested by client:  [facility] [place]:  Wilder GladeMelva Aggie, phone number [phone].  Spouse of client wrote down the above information regarding Melva Aggie provided by CSW on 10/01/14.  Spouse of client to share above information with client on 10/01/14.  Spouse of client was appreciative of phone call from CSW on 10/01/14.   Kelton PillarMichael S.Bora Broner MSW, LCSW Licensed Clinical Social Worker [facility] [phone]

## 2014-10-09 ENCOUNTER — Encounter (HOSPITAL_COMMUNITY): Payer: Self-pay | Admitting: *Deleted

## 2014-10-09 ENCOUNTER — Emergency Department (HOSPITAL_COMMUNITY): Payer: Medicare PPO

## 2014-10-09 ENCOUNTER — Inpatient Hospital Stay (HOSPITAL_COMMUNITY)
Admission: EM | Admit: 2014-10-09 | Discharge: 2014-10-13 | DRG: 190 | Disposition: A | Discharge status: Home / Self Care | Payer: Medicare PPO | Attending: Internal Medicine | Admitting: Internal Medicine

## 2014-10-09 DIAGNOSIS — J9601 Acute respiratory failure with hypoxia: Secondary | ICD-10-CM | POA: Diagnosis present

## 2014-10-09 DIAGNOSIS — F329 Major depressive disorder, single episode, unspecified: Secondary | ICD-10-CM | POA: Diagnosis present

## 2014-10-09 DIAGNOSIS — D509 Iron deficiency anemia, unspecified: Secondary | ICD-10-CM | POA: Diagnosis present

## 2014-10-09 DIAGNOSIS — K3184 Gastroparesis: Secondary | ICD-10-CM | POA: Diagnosis present

## 2014-10-09 DIAGNOSIS — Z7982 Long term (current) use of aspirin: Secondary | ICD-10-CM | POA: Diagnosis not present

## 2014-10-09 DIAGNOSIS — Z91013 Allergy to seafood: Secondary | ICD-10-CM | POA: Diagnosis not present

## 2014-10-09 DIAGNOSIS — J209 Acute bronchitis, unspecified: Secondary | ICD-10-CM | POA: Diagnosis present

## 2014-10-09 DIAGNOSIS — E039 Hypothyroidism, unspecified: Secondary | ICD-10-CM | POA: Diagnosis present

## 2014-10-09 DIAGNOSIS — R6889 Other general symptoms and signs: Secondary | ICD-10-CM

## 2014-10-09 DIAGNOSIS — J441 Chronic obstructive pulmonary disease with (acute) exacerbation: Principal | ICD-10-CM | POA: Diagnosis present

## 2014-10-09 DIAGNOSIS — J440 Chronic obstructive pulmonary disease with acute lower respiratory infection: Secondary | ICD-10-CM | POA: Diagnosis present

## 2014-10-09 DIAGNOSIS — Z981 Arthrodesis status: Secondary | ICD-10-CM

## 2014-10-09 DIAGNOSIS — Z87891 Personal history of nicotine dependence: Secondary | ICD-10-CM

## 2014-10-09 DIAGNOSIS — Z7951 Long term (current) use of inhaled steroids: Secondary | ICD-10-CM | POA: Diagnosis not present

## 2014-10-09 DIAGNOSIS — M1990 Unspecified osteoarthritis, unspecified site: Secondary | ICD-10-CM | POA: Diagnosis present

## 2014-10-09 DIAGNOSIS — Z91040 Latex allergy status: Secondary | ICD-10-CM

## 2014-10-09 DIAGNOSIS — N3010 Interstitial cystitis (chronic) without hematuria: Secondary | ICD-10-CM | POA: Diagnosis present

## 2014-10-09 DIAGNOSIS — K589 Irritable bowel syndrome without diarrhea: Secondary | ICD-10-CM | POA: Diagnosis present

## 2014-10-09 DIAGNOSIS — Z888 Allergy status to other drugs, medicaments and biological substances status: Secondary | ICD-10-CM

## 2014-10-09 DIAGNOSIS — M797 Fibromyalgia: Secondary | ICD-10-CM | POA: Diagnosis present

## 2014-10-09 DIAGNOSIS — Z882 Allergy status to sulfonamides status: Secondary | ICD-10-CM

## 2014-10-09 DIAGNOSIS — R0602 Shortness of breath: Secondary | ICD-10-CM | POA: Diagnosis not present

## 2014-10-09 DIAGNOSIS — E860 Dehydration: Secondary | ICD-10-CM | POA: Diagnosis present

## 2014-10-09 DIAGNOSIS — G8929 Other chronic pain: Secondary | ICD-10-CM | POA: Diagnosis present

## 2014-10-09 DIAGNOSIS — J309 Allergic rhinitis, unspecified: Secondary | ICD-10-CM | POA: Diagnosis not present

## 2014-10-09 DIAGNOSIS — I10 Essential (primary) hypertension: Secondary | ICD-10-CM | POA: Diagnosis present

## 2014-10-09 DIAGNOSIS — K219 Gastro-esophageal reflux disease without esophagitis: Secondary | ICD-10-CM | POA: Diagnosis present

## 2014-10-09 HISTORY — DX: Nausea with vomiting, unspecified: R11.2

## 2014-10-09 HISTORY — DX: Chronic obstructive pulmonary disease, unspecified: J44.9

## 2014-10-09 HISTORY — DX: Failed or difficult intubation, initial encounter: T88.4XXA

## 2014-10-09 HISTORY — DX: Other specified postprocedural states: Z98.890

## 2014-10-09 LAB — CBC WITH DIFFERENTIAL/PLATELET
Basophils Absolute: 0 10*3/uL (ref 0.0–0.1)
Basophils Relative: 0 % (ref 0–1)
EOS ABS: 0.1 10*3/uL (ref 0.0–0.7)
Eosinophils Relative: 1 % (ref 0–5)
HCT: 37.5 % (ref 36.0–46.0)
Hemoglobin: 12.8 g/dL (ref 12.0–15.0)
Lymphocytes Relative: 8 % — ABNORMAL LOW (ref 12–46)
Lymphs Abs: 0.7 10*3/uL (ref 0.7–4.0)
MCH: 31.8 pg (ref 26.0–34.0)
MCHC: 34.1 g/dL (ref 30.0–36.0)
MCV: 93.3 fL (ref 78.0–100.0)
MONO ABS: 0.7 10*3/uL (ref 0.1–1.0)
MONOS PCT: 8 % (ref 3–12)
NEUTROS ABS: 7.4 10*3/uL (ref 1.7–7.7)
Neutrophils Relative %: 83 % — ABNORMAL HIGH (ref 43–77)
Platelets: 212 10*3/uL (ref 150–400)
RBC: 4.02 MIL/uL (ref 3.87–5.11)
RDW: 12.4 % (ref 11.5–15.5)
WBC: 8.9 10*3/uL (ref 4.0–10.5)

## 2014-10-09 LAB — COMPREHENSIVE METABOLIC PANEL
ALBUMIN: 3.6 g/dL (ref 3.5–5.2)
ALT: 17 U/L (ref 0–35)
ANION GAP: 12 (ref 5–15)
AST: 25 U/L (ref 0–37)
Alkaline Phosphatase: 101 U/L (ref 39–117)
BUN: 11 mg/dL (ref 6–23)
CO2: 26 mmol/L (ref 19–32)
Calcium: 8.9 mg/dL (ref 8.4–10.5)
Chloride: 89 mmol/L — ABNORMAL LOW (ref 96–112)
Creatinine, Ser: 0.82 mg/dL (ref 0.50–1.10)
GFR calc non Af Amer: 71 mL/min — ABNORMAL LOW (ref >90)
GFR, EST AFRICAN AMERICAN: 83 mL/min — AB (ref >90)
GLUCOSE: 124 mg/dL — AB (ref 70–99)
POTASSIUM: 3.6 mmol/L (ref 3.5–5.1)
SODIUM: 127 mmol/L — AB (ref 135–145)
TOTAL PROTEIN: 6.5 g/dL (ref 6.0–8.3)
Total Bilirubin: 0.5 mg/dL (ref 0.3–1.2)

## 2014-10-09 LAB — I-STAT TROPONIN, ED: Troponin i, poc: 0 ng/mL (ref 0.00–0.08)

## 2014-10-09 LAB — URINALYSIS, ROUTINE W REFLEX MICROSCOPIC
Bilirubin Urine: NEGATIVE
GLUCOSE, UA: NEGATIVE mg/dL
Hgb urine dipstick: NEGATIVE
Ketones, ur: NEGATIVE mg/dL
NITRITE: NEGATIVE
Protein, ur: NEGATIVE mg/dL
SPECIFIC GRAVITY, URINE: 1.01 (ref 1.005–1.030)
Urobilinogen, UA: 0.2 mg/dL (ref 0.0–1.0)
pH: 7 (ref 5.0–8.0)

## 2014-10-09 LAB — I-STAT CG4 LACTIC ACID, ED
LACTIC ACID, VENOUS: 2.1 mmol/L — AB (ref 0.5–2.0)
Lactic Acid, Venous: 2.48 mmol/L (ref 0.5–2.0)

## 2014-10-09 LAB — URINE MICROSCOPIC-ADD ON

## 2014-10-09 MED ORDER — DULOXETINE HCL 60 MG PO CPEP
60.0000 mg | ORAL_CAPSULE | Freq: Every day | ORAL | Status: DC
  Administered 2014-10-10 – 2014-10-13 (×4): 60 mg via ORAL
  Filled 2014-10-09 (×4): qty 1

## 2014-10-09 MED ORDER — MONTELUKAST SODIUM 10 MG PO TABS
10.0000 mg | ORAL_TABLET | Freq: Every day | ORAL | Status: DC
  Administered 2014-10-09 – 2014-10-12 (×4): 10 mg via ORAL
  Filled 2014-10-09 (×5): qty 1

## 2014-10-09 MED ORDER — BUPROPION HCL ER (XL) 300 MG PO TB24
300.0000 mg | ORAL_TABLET | Freq: Every day | ORAL | Status: DC
  Administered 2014-10-09 – 2014-10-12 (×4): 300 mg via ORAL
  Filled 2014-10-09 (×5): qty 1

## 2014-10-09 MED ORDER — METHYLPREDNISOLONE SODIUM SUCC 125 MG IJ SOLR
60.0000 mg | Freq: Two times a day (BID) | INTRAMUSCULAR | Status: DC
  Administered 2014-10-10 – 2014-10-11 (×4): 60 mg via INTRAVENOUS
  Filled 2014-10-09 (×6): qty 0.96

## 2014-10-09 MED ORDER — ALBUTEROL SULFATE (2.5 MG/3ML) 0.083% IN NEBU
5.0000 mg | INHALATION_SOLUTION | Freq: Once | RESPIRATORY_TRACT | Status: AC
Start: 2014-10-09 — End: 2014-10-09
  Administered 2014-10-09: 5 mg via RESPIRATORY_TRACT
  Filled 2014-10-09: qty 6

## 2014-10-09 MED ORDER — PENTOSAN POLYSULFATE SODIUM 100 MG PO CAPS
200.0000 mg | ORAL_CAPSULE | Freq: Two times a day (BID) | ORAL | Status: DC
  Administered 2014-10-09 – 2014-10-13 (×8): 200 mg via ORAL
  Filled 2014-10-09 (×9): qty 2

## 2014-10-09 MED ORDER — ONDANSETRON HCL 4 MG/2ML IJ SOLN: 4.0000 mg | Freq: Three times a day (TID) | INTRAMUSCULAR | Status: AC | PRN

## 2014-10-09 MED ORDER — SODIUM CHLORIDE 0.9 % IV SOLN
INTRAVENOUS | Status: DC
  Administered 2014-10-09: via INTRAVENOUS

## 2014-10-09 MED ORDER — ALBUTEROL SULFATE (2.5 MG/3ML) 0.083% IN NEBU: 5.0000 mg | INHALATION_SOLUTION | RESPIRATORY_TRACT | Status: AC | PRN

## 2014-10-09 MED ORDER — PREDNISONE 20 MG PO TABS
60.0000 mg | ORAL_TABLET | Freq: Once | ORAL | Status: AC
  Administered 2014-10-09: 60 mg via ORAL
  Filled 2014-10-09: qty 3

## 2014-10-09 MED ORDER — FENTANYL 25 MCG/HR TD PT72: 75.0000 ug | MEDICATED_PATCH | TRANSDERMAL | Status: DC

## 2014-10-09 MED ORDER — ALPRAZOLAM 0.25 MG PO TABS
0.2500 mg | ORAL_TABLET | Freq: Three times a day (TID) | ORAL | Status: DC | PRN
  Administered 2014-10-10 – 2014-10-13 (×8): 0.25 mg via ORAL
  Filled 2014-10-09 (×8): qty 1

## 2014-10-09 MED ORDER — HYDROCOD POLST-CHLORPHEN POLST 10-8 MG/5ML PO LQCR
5.0000 mL | Freq: Once | ORAL | Status: AC
  Administered 2014-10-09: 5 mL via ORAL
  Filled 2014-10-09: qty 5

## 2014-10-09 MED ORDER — CILIDINIUM-CHLORDIAZEPOXIDE 2.5-5 MG PO CAPS
1.0000 | ORAL_CAPSULE | Freq: Two times a day (BID) | ORAL | Status: DC | PRN
  Filled 2014-10-09: qty 1

## 2014-10-09 MED ORDER — IRBESARTAN 300 MG PO TABS
300.0000 mg | ORAL_TABLET | Freq: Every day | ORAL | Status: DC
  Administered 2014-10-10 – 2014-10-13 (×4): 300 mg via ORAL
  Filled 2014-10-09 (×4): qty 1

## 2014-10-09 MED ORDER — IPRATROPIUM BROMIDE 0.02 % IN SOLN
0.5000 mg | Freq: Once | RESPIRATORY_TRACT | Status: AC
  Administered 2014-10-09: 0.5 mg via RESPIRATORY_TRACT
  Filled 2014-10-09: qty 2.5

## 2014-10-09 MED ORDER — TRIAMTERENE-HCTZ 75-50 MG PO TABS
1.0000 | ORAL_TABLET | Freq: Every morning | ORAL | Status: DC
  Filled 2014-10-09: qty 1

## 2014-10-09 MED ORDER — OXYCODONE HCL 5 MG PO TABS
10.0000 mg | ORAL_TABLET | ORAL | Status: DC | PRN
  Administered 2014-10-10 – 2014-10-13 (×10): 10 mg via ORAL
  Filled 2014-10-09 (×11): qty 2

## 2014-10-09 MED ORDER — AMLODIPINE BESYLATE 5 MG PO TABS
5.0000 mg | ORAL_TABLET | Freq: Every day | ORAL | Status: DC
  Filled 2014-10-09: qty 1

## 2014-10-09 MED ORDER — ONDANSETRON 4 MG PO TBDP
8.0000 mg | ORAL_TABLET | Freq: Four times a day (QID) | ORAL | Status: DC | PRN
  Filled 2014-10-09: qty 2

## 2014-10-09 MED ORDER — ACYCLOVIR 400 MG PO TABS
400.0000 mg | ORAL_TABLET | Freq: Two times a day (BID) | ORAL | Status: DC
  Administered 2014-10-09 – 2014-10-13 (×8): 400 mg via ORAL
  Filled 2014-10-09 (×10): qty 1

## 2014-10-09 MED ORDER — BISACODYL 5 MG PO TBEC
5.0000 mg | DELAYED_RELEASE_TABLET | Freq: Every day | ORAL | Status: DC | PRN
Start: 2014-10-09 — End: 2014-10-11
  Administered 2014-10-10: 5 mg via ORAL
  Filled 2014-10-09: qty 1

## 2014-10-09 MED ORDER — MAGNESIUM HYDROXIDE 400 MG/5ML PO SUSP
15.0000 mL | Freq: Every day | ORAL | Status: DC
  Filled 2014-10-09 (×2): qty 30

## 2014-10-09 MED ORDER — GUAIFENESIN ER 600 MG PO TB12
600.0000 mg | ORAL_TABLET | Freq: Two times a day (BID) | ORAL | Status: DC
  Administered 2014-10-09 – 2014-10-11 (×4): 600 mg via ORAL
  Filled 2014-10-09 (×5): qty 1

## 2014-10-09 MED ORDER — METHYLPHENIDATE HCL 5 MG PO TABS: 50.0000 mg | ORAL_TABLET | ORAL | Status: DC

## 2014-10-09 MED ORDER — IPRATROPIUM-ALBUTEROL 0.5-2.5 (3) MG/3ML IN SOLN
3.0000 mL | RESPIRATORY_TRACT | Status: DC
  Filled 2014-10-09: qty 3

## 2014-10-09 MED ORDER — MENTHOL 3 MG MT LOZG
1.0000 | LOZENGE | OROMUCOSAL | Status: DC | PRN
  Administered 2014-10-09 – 2014-10-10 (×2): 3 mg via ORAL
  Filled 2014-10-09 (×3): qty 9

## 2014-10-09 MED ORDER — MELOXICAM 15 MG PO TABS
15.0000 mg | ORAL_TABLET | Freq: Every day | ORAL | Status: DC
  Administered 2014-10-09 – 2014-10-12 (×4): 15 mg via ORAL
  Filled 2014-10-09 (×5): qty 1

## 2014-10-09 MED ORDER — ALBUTEROL SULFATE (2.5 MG/3ML) 0.083% IN NEBU
5.0000 mg | INHALATION_SOLUTION | Freq: Once | RESPIRATORY_TRACT | Status: AC
  Administered 2014-10-09: 5 mg via RESPIRATORY_TRACT
  Filled 2014-10-09: qty 6

## 2014-10-09 MED ORDER — SODIUM CHLORIDE 0.9 % IV BOLUS (SEPSIS)
1000.0000 mL | Freq: Once | INTRAVENOUS | Status: AC
  Administered 2014-10-09: 1000 mL via INTRAVENOUS

## 2014-10-09 MED ORDER — LEVOTHYROXINE SODIUM 75 MCG PO TABS
75.0000 ug | ORAL_TABLET | Freq: Every day | ORAL | Status: DC
  Administered 2014-10-10 – 2014-10-13 (×4): 75 ug via ORAL
  Filled 2014-10-09 (×5): qty 1

## 2014-10-09 MED ORDER — ASPIRIN EC 81 MG PO TBEC
81.0000 mg | DELAYED_RELEASE_TABLET | Freq: Every day | ORAL | Status: DC
  Administered 2014-10-09 – 2014-10-12 (×4): 81 mg via ORAL
  Filled 2014-10-09 (×5): qty 1

## 2014-10-09 MED ORDER — HEPARIN SODIUM (PORCINE) 5000 UNIT/ML IJ SOLN
5000.0000 [IU] | Freq: Three times a day (TID) | INTRAMUSCULAR | Status: DC
  Administered 2014-10-09 – 2014-10-13 (×11): 5000 [IU] via SUBCUTANEOUS
  Filled 2014-10-09 (×12): qty 1

## 2014-10-09 MED ORDER — TOPIRAMATE 25 MG PO TABS
50.0000 mg | ORAL_TABLET | Freq: Three times a day (TID) | ORAL | Status: DC
  Administered 2014-10-09 – 2014-10-10 (×3): 50 mg via ORAL
  Filled 2014-10-09 (×4): qty 2

## 2014-10-09 MED ORDER — BENZONATATE 100 MG PO CAPS
100.0000 mg | ORAL_CAPSULE | Freq: Three times a day (TID) | ORAL | Status: DC
  Administered 2014-10-09 – 2014-10-13 (×11): 100 mg via ORAL
  Filled 2014-10-09 (×15): qty 1

## 2014-10-09 MED ORDER — LEVOFLOXACIN IN D5W 750 MG/150ML IV SOLN
750.0000 mg | Freq: Every day | INTRAVENOUS | Status: DC
  Administered 2014-10-09 – 2014-10-11 (×3): 750 mg via INTRAVENOUS
  Filled 2014-10-09 (×4): qty 150

## 2014-10-09 MED ORDER — PANTOPRAZOLE SODIUM 40 MG PO TBEC
40.0000 mg | DELAYED_RELEASE_TABLET | Freq: Every day | ORAL | Status: DC
  Administered 2014-10-10 – 2014-10-13 (×4): 40 mg via ORAL
  Filled 2014-10-09 (×2): qty 1

## 2014-10-09 NOTE — H&P (Signed)
[facility] History and Physical  Patient: Kristina Horton  MRN: [id number]  DOB: [birth year]  DOS: the patient was seen and examined on 10/09/2014 PCP: Fredirick Maudlin, MD  Chief Complaint: Cough and shortness of breath  HPI: Kristina Horton is a 69 y.o. female with Past medical history of essential hypertension, irritable bowel syndrome, interstitial cystitis, fibromyalgia, gastroparesis, COPD. The patient is presenting with complaints of cough and shortness of breath progressively worsening since Sunday. Patient mentions that last week she had sick exposure to her grandkids as well as one of the providers. Over the weekend she started having progressively worsening cough with greenish expectoration. She denies any blood in the sputum. Later on she also started noticing fever without chills. She complains of generalized fatigue and will malaise that has been progressively worsening. She also does of some myalgia. With that she started having progressively worsening shortness of breath and therefore she came to the ER. She mentions that that she has started taking erythromycin on her own since Sunday. She called her provider on Monday and continue taking erythromycin and despite that she has not improved. She also has been using over-the-counter cough suppressant as well as prescription cough suppressant.  The patient is coming from home. And at her baseline independent for most of her ADL.  Review of Systems: as mentioned in the history of present illness.  A Comprehensive review of the other systems is negative.  Past Medical History  Diagnosis Date  . Essential hypertension, benign   . IBS (irritable bowel syndrome)   . IC (interstitial cystitis)   . Allergic rhinitis   . Fibromyalgia   . Osteoarthritis   . Iron deficiency anemia 05/25/2012    Requiring IV Feraheme   . Gastroparesis     2014  . DJD (degenerative joint disease)   . Depression    Past Surgical  History  Procedure Laterality Date  . Spinal fusion  2010    C2-T2, done in [place]  . Vesicovaginal fistula closure w/ tah  1998  . Appendectomy  1974  . Breast surgery  1962    benign rumor  . Thyroidectomy, partial    . Vesico-vaginal fistula repair  1997  . Lumbar fusion      20 11  . Anterior cervical decomp/discectomy fusion    . Colonoscopy with propofol  05/29/2012    Procedure: COLONOSCOPY WITH PROPOFOL;  Surgeon: Petra Kuba, MD;  Location: [facility];  Service: Endoscopy;  Laterality: N/A;  needs general  . Esophagogastroduodenoscopy (egd) with propofol  05/29/2012    Procedure: ESOPHAGOGASTRODUODENOSCOPY (EGD) WITH PROPOFOL;  Surgeon: Petra Kuba, MD;  Location: [facility];  Service: Endoscopy;  Laterality: N/A;   Social History:  reports that she quit smoking about 36 years ago. Her smoking use included Cigarettes. She has a 54 pack-year smoking history. She has never used smokeless tobacco. She reports that she does not drink alcohol or use illicit drugs.  Allergies  Allergen Reactions  . D-Xylitol Other (See Comments)    "causes the inside of the mouth to peel"  . Fluconazole     Can take brand name Diflucan not generic  . Iodine Rash    Unknown  . Latex Itching and Rash    Takes her skin off.  . Meperidine Nausea And Vomiting  . Other Other (See Comments)    Blueberries, "DIAL SOAP"  . Selenium Nausea And Vomiting  . Shellfish-Derived Products Nausea And Vomiting and Other (See Comments)  Other reaction(s): Unknown  . Sulfa Antibiotics Hives and Other (See Comments)    Flu-like symptoms  . Tape Itching    Skin irritation  Takes skin off, needs "paper" tape.  . Demerol Nausea And Vomiting  . Sulfa Drugs Cross Reactors Nausea And Vomiting    Fever, welts, hives, flu like  . Cleocin  [Clindamycin Hcl] Nausea Only  . Tizanidine Other (See Comments)    Confusion hallucinations    Family History  Problem Relation Age of Onset  . COPD Mother   .  Heart failure Mother   . Alcohol abuse Mother   . Diabetes Mother   . Heart failure Father   . Alcohol abuse Brother     Prior to Admission medications   Medication Sig Start Date End Date Taking? Authorizing Provider  acyclovir (ZOVIRAX) 400 MG tablet Take 400 mg by mouth 2 (two) times daily.     Yes Historical Provider, MD  ALPRAZolam (XANAX) 0.25 MG tablet Take 0.25 mg by mouth 3 (three) times daily as needed. And every night.   Yes Historical Provider, MD  amLODipine (NORVASC) 5 MG tablet Take 5 mg by mouth daily. 10/02/14  Yes Historical Provider, MD  aspirin EC 81 MG tablet Take 81 mg by mouth at bedtime.   Yes Historical Provider, MD  bisacodyl (BISACODYL) 5 MG EC tablet Take 5 mg by mouth daily as needed. For stool softener   Yes Historical Provider, MD  buPROPion (WELLBUTRIN XL) 300 MG 24 hr tablet Take 300 mg by mouth at bedtime.    Yes Historical Provider, MD  cetirizine (ZYRTEC) 10 MG tablet Take 10 mg by mouth at bedtime.  10/11/11  Yes Storm FriskPatrick E Wright, MD  Cholecalciferol (VITAMIN D-3) 1000 UNITS CAPS Take 1,000 Units by mouth at bedtime.    Yes Historical Provider, MD  clidinium-chlordiazePOXIDE (LIBRAX) 5-2.5 MG per capsule Take 1 capsule by mouth 2 (two) times daily as needed (for chafing).   Yes Historical Provider, MD  Diclofenac Sodium 1.5 % SOLN Place 40 drops onto the skin 4 (four) times daily as needed (pain).    Yes Historical Provider, MD  DULoxetine (CYMBALTA) 60 MG capsule Take 60 mg by mouth daily.  05/22/07  Yes Historical Provider, MD  fentaNYL (DURAGESIC - DOSED MCG/HR) 75 MCG/HR Place 75 mcg onto the skin every 3 (three) days.  09/18/14  Yes Historical Provider, MD  fluticasone (CUTIVATE) 0.05 % cream Apply 1 application topically 2 (two) times daily.  10/03/14  Yes Historical Provider, MD  guaiFENesin (MUCINEX) 600 MG 12 hr tablet Take 600 mg by mouth as needed for cough.    Yes Historical Provider, MD  HYDROcodone-homatropine (HYCODAN) 5-1.5 MG/5ML syrup Take 5 mLs  by mouth every 4 (four) hours as needed. For cough   Yes Historical Provider, MD  ketoconazole (NIZORAL) 2 % cream Apply 1 application topically 2 (two) times daily.  10/03/14  Yes Historical Provider, MD  levalbuterol Pauline Aus(XOPENEX HFA) 45 MCG/ACT inhaler Inhale 2 puffs into the lungs 3 (three) times daily.   Yes Historical Provider, MD  levothyroxine (SYNTHROID, LEVOTHROID) 75 MCG tablet Take 75 mcg by mouth every morning.    Yes Historical Provider, MD  Lidocaine, Anorectal, 5 % CREA Apply topically daily as needed. 04/21/05  Yes Historical Provider, MD  Magnesium Hydroxide (PHILLIPS MILK OF MAGNESIA PO) Take 1-2 capsules by mouth at bedtime.    Yes Historical Provider, MD  meloxicam (MOBIC) 15 MG tablet Take 15 mg by mouth at bedtime.  09/27/14  Yes Historical Provider, MD  methylphenidate (RITALIN) 20 MG tablet Take 50 mg by mouth every morning.    Yes Historical Provider, MD  montelukast (SINGULAIR) 10 MG tablet Take 10 mg by mouth at bedtime.   Yes Historical Provider, MD  ondansetron (ZOFRAN-ODT) 8 MG disintegrating tablet Take 8 mg by mouth every 6 (six) hours as needed for nausea or vomiting.    Yes Historical Provider, MD  oxycodone (OXY-IR) 5 MG capsule Take 10 mg by mouth every 4 (four) hours as needed. For break through pain   Yes Historical Provider, MD  pentosan polysulfate (ELMIRON) 100 MG capsule Take 200 mg by mouth 2 (two) times daily.    Yes Historical Provider, MD  potassium chloride (KLOR-CON) 10 MEQ CR tablet Take 30 mEq by mouth 3 (three) times daily. Patient takes up to 70-80 meq daily   Yes Historical Provider, MD  Probiotic Product (ACIDOPHILUS/GOAT MILK) CAPS Take 1 tablet by mouth at bedtime. Ultimate Flora   Yes Historical Provider, MD  RABEprazole (ACIPHEX) 20 MG tablet Take 20 mg by mouth 2 (two) times daily. 40 mg in the morning and 20 mg at night.   Yes Historical Provider, MD  sodium chloride (OCEAN) 0.65 % nasal spray Place 1 spray into the nose as needed. For nasal  decongestion   Yes Historical Provider, MD  talc powder Apply 1 application topically as needed.   Yes Historical Provider, MD  topiramate (TOPAMAX) 50 MG tablet Take 50 mg by mouth 3 (three) times daily.    Yes Historical Provider, MD  triamcinolone (NASACORT) 55 MCG/ACT nasal inhaler Place 2 sprays into the nose as needed. 05/12/12  Yes Leslye Peer, MD  triamterene-hydrochlorothiazide (MAXZIDE) 75-50 MG per tablet Take 1 tablet by mouth every morning.    Yes Historical Provider, MD  valsartan (DIOVAN) 320 MG tablet Take 320 mg by mouth daily.   Yes Historical Provider, MD  ciprofloxacin (CIPRO) 500 MG tablet Take 250 mg by mouth daily as needed (urinary tract infection).     Historical Provider, MD  EPINEPHrine (EPIPEN 2-PAK) 0.3 mg/0.3 mL DEVI Inject 0.3 mg into the muscle once. To food allergies, delayed reaction    Historical Provider, MD  hydrOXYzine (VISTARIL) 25 MG capsule Take 1 capsule (25 mg total) by mouth 3 (three) times daily as needed. Patient not taking: Reported on 10/09/2014 08/20/13   Len Blalock, NP    Physical Exam: Filed Vitals:   10/09/14 1915 10/09/14 1930 10/09/14 2045 10/09/14 2100  BP: 127/50 127/62 134/94 139/62  Pulse: 125 107 115 110  Temp:      Resp: Height:      Weight:      SpO2: 90% 95% 91% 94%    General: Alert, Awake and Oriented to Time, Place and Person. Appear in moderate distress Eyes: PERRL ENT: Oral Mucosa clear moist. Neck: no JVD Cardiovascular: S1 and S2 Present, no Murmur, Peripheral Pulses Present Respiratory: Bilateral Air entry equal and Decreased, bilateral rhonchi and Crackles, extensive expiratory wheezes Abdomen: Bowel Sound present, Soft and non tender Skin: no Rash Extremities: no Pedal edema, no calf tenderness Neurologic: Grossly no focal neuro deficit.  Labs on Admission:  CBC:  Recent Labs Lab 10/09/14 1543  WBC 8.9  NEUTROABS 7.4  HGB 12.8  HCT 37.5  MCV 93.3  PLT 212    CMP     Component  Value Date/Time   NA 127* 10/09/2014 1543   K 3.6 10/09/2014 1543  CL 89* 10/09/2014 1543   CO2 26 10/09/2014 1543   GLUCOSE 124* 10/09/2014 1543   BUN 11 10/09/2014 1543   CREATININE 0.82 10/09/2014 1543   CALCIUM 8.9 10/09/2014 1543   PROT 6.5 10/09/2014 1543   ALBUMIN 3.6 10/09/2014 1543   AST 25 10/09/2014 1543   ALT 17 10/09/2014 1543   ALKPHOS 101 10/09/2014 1543   BILITOT 0.5 10/09/2014 1543   GFRNONAA 71* 10/09/2014 1543   GFRAA 83* 10/09/2014 1543    No results for input(s): LIPASE, AMYLASE in the last 168 hours.  No results for input(s): CKTOTAL, CKMB, CKMBINDEX, TROPONINI in the last 168 hours. BNP (last 3 results) No results for input(s): BNP in the last 8760 hours.  ProBNP (last 3 results) No results for input(s): PROBNP in the last 8760 hours.   Radiological Exams on Admission: Dg Chest 2 View  10/09/2014   CLINICAL DATA:  Cough and short of breath  EXAM: CHEST  2 VIEW  COMPARISON:  Radiograph 07/10/2014  FINDINGS: Anterior posterior cervical fusion noted. Posterior lumbar fusion noted.  Normal cardiac silhouette. Chronic bronchitic markings. No effusion, infiltrate, or pneumothorax.  Severe degenerative change in the right shoulder.  IMPRESSION: Chronic bronchitic markings.  No acute findings.   Electronically Signed   By: Genevive Bi M.D.   On: 10/09/2014 16:37    Assessment/Plan Principal Problem:   COPD exacerbation Active Problems:   Fibromyalgia   IC (interstitial cystitis)   Essential hypertension, benign   Allergic rhinitis   Iron deficiency anemia   Gastroparesis   1. COPD exacerbation The patient presented with complaints of shortness of breath, cough; she has expiratory wheezing, respiratory distress, tachypnea, with use of accessory muscles of respiration, hypoxia. Patient will be admitted for COPD exacerbation secondary to acute bronchitis. I will get sputum culture, urine antigens, influenza PCR. I will treat the patient with IV  Solu-Medrol 60 mg Q 12hours, DUONEB every 4 hours, oxygen as needed, IV antibiotics levofloxacin Mucinex as needed as well as flutter device.   2. Chronic pain management. Interstitial cystitis. Fibromyalgia. Continuing Cymbalta, fentanyl patch, OxyIR, meloxicam and other medications as per home doses.  3. Essential hypertension. Continuing home medications.  4. GERD. Continuing PPI.  5. Hypothyroidism. Continuing levothyroxine  Advance goals of care discussion: Full code  DVT Prophylaxis: subcutaneous Heparin. Nutrition: Regular diet  Family Communication: Family was present at bedside, opportunity was given to ask question and all questions were answered satisfactorily at the time of interview. Disposition: Admitted to inpatient in telemetry unit.  Author: Lynden Oxford, MD [facility] Pager: [phone] 10/09/2014, 10:07 PM    If 7PM-7AM, please contact night-coverage [email or web address] Password [email or web address]

## 2014-10-09 NOTE — ED Notes (Signed)
Pt okay to take her own aprazolam per [place], [place]

## 2014-10-09 NOTE — ED Provider Notes (Signed)
CSN: [id number]     Arrival date & time 10/09/14  1523 History   First MD Initiated Contact with Patient 10/09/14 1725     Chief Complaint  Patient presents with  . Cough  . Fever     (Consider location/radiation/quality/duration/timing/severity/associated sxs/prior Treatment) HPI Kristina Horton is a 69 y.o. female with history of hypertension, interstitial cystitis, fibromyalgia, Sjogren's syndrome, irritable bowel syndrome, presents to emergency department complaining of fever, chills, congestion, cough. Symptoms started 3 days ago. She states she called her primary care doctor when her symptoms began who prescribed her Vicodin and erythromycin 400 mg oral. She states that she has had fever up to 101 at home. She admits to some nausea and vomiting. Unable to sleep well due to cough. Today she started falling asleep randomly throughout the day. Patient is very anxious, states she is very concerned that this may be something more than just flulike symptoms. She states her brother died from sepsis at age 70. She denies taking evening for her fever today. Last fever was this morning and was 101. Has a chest pain or abdominal pain. She states she feels short of breath. States has generalized pain.  Past Medical History  Diagnosis Date  . Essential hypertension, benign   . IBS (irritable bowel syndrome)   . IC (interstitial cystitis)   . Allergic rhinitis   . Fibromyalgia   . Osteoarthritis   . Iron deficiency anemia 05/25/2012    Requiring IV Feraheme   . Gastroparesis     2014  . DJD (degenerative joint disease)   . Depression    Past Surgical History  Procedure Laterality Date  . Spinal fusion  2010    C2-T2, done in [place]  . Vesicovaginal fistula closure w/ tah  1998  . Appendectomy  1974  . Breast surgery  1962    benign rumor  . Thyroidectomy, partial    . Vesico-vaginal fistula repair  1997  . Lumbar fusion      2011  . Anterior cervical decomp/discectomy fusion     . Colonoscopy with propofol  05/29/2012    Procedure: COLONOSCOPY WITH PROPOFOL;  Surgeon: Petra KubaMarc E Magod, MD;  Location: [facility];  Service: Endoscopy;  Laterality: N/A;  needs general  . Esophagogastroduodenoscopy (egd) with propofol  05/29/2012    Procedure: ESOPHAGOGASTRODUODENOSCOPY (EGD) WITH PROPOFOL;  Surgeon: Petra KubaMarc E Magod, MD;  Location: [facility];  Service: Endoscopy;  Laterality: N/A;   Family History  Problem Relation Age of Onset  . COPD Mother   . Heart failure Mother   . Alcohol abuse Mother   . Diabetes Mother   . Heart failure Father   . Alcohol abuse Brother    History  Substance Use Topics  . Smoking status: Former Smoker -- 3.00 packs/day for 18 years    Types: Cigarettes    Quit date: 07/12/1978  . Smokeless tobacco: Never Used  . Alcohol Use: No   OB History    No data available     Review of Systems  Constitutional: Positive for fever and chills.  HENT: Positive for congestion and sore throat. Negative for trouble swallowing.   Respiratory: Positive for cough and shortness of breath. Negative for chest tightness.   Cardiovascular: Negative for chest pain, palpitations and leg swelling.  Gastrointestinal: Positive for nausea and vomiting. Negative for abdominal pain and diarrhea.  Genitourinary: Negative for dysuria, flank pain, vaginal bleeding, vaginal discharge, vaginal pain and pelvic pain.  Musculoskeletal: Negative for myalgias, arthralgias, neck  pain and neck stiffness.  Skin: Negative for rash.  Neurological: Positive for weakness and headaches. Negative for dizziness.  All other systems reviewed and are negative.     Allergies  Demerol; Fluconazole; Iodine; Latex; Shellfish-derived products; Sulfa drugs cross reactors; Tape; and Tizanidine  Home Medications   Prior to Admission medications   Medication Sig Start Date End Date Taking? Authorizing Provider  acyclovir (ZOVIRAX) 400 MG tablet Take 400 mg by mouth 2 (two) times daily.       Historical Provider, MD  ALPRAZolam Prudy Feeler) 0.25 MG tablet Take 0.25 mg by mouth at bedtime as needed for anxiety.    Historical Provider, MD  aspirin EC 325 MG tablet Take 81 mg by mouth daily.     Historical Provider, MD  bisacodyl (BISACODYL) 5 MG EC tablet Take 5 mg by mouth daily as needed. For stool softener    Historical Provider, MD  buPROPion (WELLBUTRIN XL) 300 MG 24 hr tablet Take 300 mg by mouth daily.    Historical Provider, MD  cetirizine (ZYRTEC) 10 MG tablet Take 10 mg by mouth at bedtime.  10/11/11   Storm Frisk, MD  ciprofloxacin (CIPRO) 500 MG tablet Take 500 mg by mouth 2 (two) times daily.    Historical Provider, MD  clobetasol (TEMOVATE) 0.05 % cream Apply 1 application topically daily as needed. In vaginal area    Historical Provider, MD  Coenzyme Q10 (CO Q 10 PO) Take 1 tablet by mouth daily. Strength unknown    Historical Provider, MD  Diclofenac Sodium (PENNSAID) 1.5 % SOLN Place onto the skin as needed.     Historical Provider, MD  DULoxetine (CYMBALTA) 60 MG capsule Take 30 mg by mouth every evening.     Historical Provider, MD  EPINEPHrine (EPIPEN 2-PAK) 0.3 mg/0.3 mL DEVI Inject 0.3 mg into the muscle once. To food allergies, delayed reaction    Historical Provider, MD  guaiFENesin (MUCINEX) 600 MG 12 hr tablet Take 600 mg by mouth as needed.     Historical Provider, MD  HYDROcodone-homatropine (HYCODAN) 5-1.5 MG/5ML syrup Take 5 mLs by mouth every 4 (four) hours as needed. For cough    Historical Provider, MD  hydrOXYzine (VISTARIL) 25 MG capsule Take 1 capsule (25 mg total) by mouth 3 (three) times daily as needed. 08/20/13   Len Blalock, NP  levalbuterol (XOPENEX HFA) 45 MCG/ACT inhaler Inhale 2 puffs into the lungs 3 (three) times daily.    Historical Provider, MD  levothyroxine (SYNTHROID, LEVOTHROID) 75 MCG tablet Take 75 mcg by mouth every morning.     Historical Provider, MD  lidocaine (XYLOCAINE) 5 % ointment Apply 1 application topically 3 (three) times  daily as needed. For itching on bites    Historical Provider, MD  Magnesium Hydroxide (PHILLIPS MILK OF MAGNESIA PO) Take 2 capsules by mouth at bedtime.    Historical Provider, MD  meloxicam (MOBIC) 7.5 MG tablet Take 15 mg by mouth at bedtime.     Historical Provider, MD  methylphenidate (RITALIN) 20 MG tablet Take 20 mg by mouth 3 (three) times daily. Takes 2 at 8 am and 1 at 10 am    Historical Provider, MD  montelukast (SINGULAIR) 10 MG tablet Take 10 mg by mouth at bedtime.    Historical Provider, MD  ondansetron (ZOFRAN-ODT) 8 MG disintegrating tablet Take 8 mg by mouth every 6 (six) hours as needed.    Historical Provider, MD  oxycodone (OXY-IR) 5 MG capsule Take 10 mg by mouth every  4 (four) hours as needed. For break through pain    Historical Provider, MD  oxymorphone (OPANA ER) 20 MG 12 hr tablet Take 40 mg by mouth every 12 (twelve) hours.      Historical Provider, MD  pentosan polysulfate (ELMIRON) 100 MG capsule Take 200 mg by mouth 2 (two) times daily.     Historical Provider, MD  phenazopyridine (PYRIDIUM) 100 MG tablet Take 100 mg by mouth 3 (three) times daily as needed. For UTI pain    Historical Provider, MD  potassium chloride (KLOR-CON) 10 MEQ CR tablet Take 10 mEq by mouth 4 (four) times daily. Patient takes up to 70-80 meq daily    Historical Provider, MD  RABEprazole (ACIPHEX) 20 MG tablet Take 20 mg by mouth 2 (two) times daily.     Historical Provider, MD  sodium chloride (OCEAN) 0.65 % nasal spray Place 1 spray into the nose as needed. For nasal decongestion    Historical Provider, MD  thiamine 100 MG tablet Take 100 mg by mouth daily.      Historical Provider, MD  topiramate (TOPAMAX) 50 MG tablet Take 50 mg by mouth 3 (three) times daily.     Historical Provider, MD  triamcinolone (NASACORT) 55 MCG/ACT nasal inhaler Place 2 sprays into the nose as needed. 05/12/12   Leslye Peer, MD  triamterene-hydrochlorothiazide (MAXZIDE) 75-50 MG per tablet Take 1 tablet by mouth  every morning.     Historical Provider, MD  valsartan-hydrochlorothiazide (DIOVAN-HCT) 160-12.5 MG per tablet Take 1 tablet by mouth every morning.     Historical Provider, MD  vitamin E 400 UNIT capsule Take 400 Units by mouth daily.      Historical Provider, MD   BP 130/61 mmHg  Pulse 107  Temp(Src) 98.8 F (37.1 C)  Resp 24  Ht 4\' 9"  (1.448 m)  Wt 170 lb (77.111 kg)  BMI 36.78 kg/m2  SpO2 95% Physical Exam  Constitutional: She is oriented to person, place, and time. She appears well-developed and well-nourished.  Patient appears very anxious, tearful.  HENT:  Head: Normocephalic.  Right Ear: Tympanic membrane, external ear and ear canal normal.  Left Ear: Tympanic membrane, external ear and ear canal normal.  Nose: Mucosal edema and rhinorrhea present.  Mouth/Throat: Uvula is midline and oropharynx is clear and moist.  Eyes: Conjunctivae are normal.  Neck: Neck supple.  Cardiovascular: Normal rate, regular rhythm and normal heart sounds.   Pulmonary/Chest: Effort normal. No respiratory distress. She has wheezes. She has no rales.  Expiratory wheezes bilaterally  Abdominal: Soft. Bowel sounds are normal. She exhibits no distension. There is no tenderness. There is no rebound.  Musculoskeletal: She exhibits no edema.  Neurological: She is alert and oriented to person, place, and time.  Skin: Skin is warm and dry.  Psychiatric: She has a normal mood and affect. Her behavior is normal.  Nursing note and vitals reviewed.   ED Course  Procedures (including critical care time) Labs Review Labs Reviewed  CBC WITH DIFFERENTIAL/PLATELET - Abnormal; Notable for the following:    Neutrophils Relative % 83 (*)    Lymphocytes Relative 8 (*)    All other components within normal limits  COMPREHENSIVE METABOLIC PANEL - Abnormal; Notable for the following:    Sodium 127 (*)    Chloride 89 (*)    Glucose, Bld 124 (*)    GFR calc non Af Amer 71 (*)    GFR calc Af Amer 83 (*)    All  other  components within normal limits  URINALYSIS, ROUTINE W REFLEX MICROSCOPIC    Imaging Review Dg Chest 2 View  10/09/2014   CLINICAL DATA:  Cough and short of breath  EXAM: CHEST  2 VIEW  COMPARISON:  Radiograph 07/10/2014  FINDINGS: Anterior posterior cervical fusion noted. Posterior lumbar fusion noted.  Normal cardiac silhouette. Chronic bronchitic markings. No effusion, infiltrate, or pneumothorax.  Severe degenerative change in the right shoulder.  IMPRESSION: Chronic bronchitic markings.  No acute findings.   Electronically Signed   By: Genevive Bi M.D.   On: 10/09/2014 16:37     EKG Interpretation   Date/Time:  Wednesday October 09 2014 17:59:16 EDT Ventricular Rate:  100 PR Interval:  156 QRS Duration: 158 QT Interval:  411 QTC Calculation: 530 R Axis:   -73 Text Interpretation:  Sinus tachycardia RBBB and LAFB No significant  change since last tracing Confirmed by KNAPP  MD-J, JON ([id number]) on  10/09/2014 6:04:44 PM      MDM   Final diagnoses:  COPD exacerbation  Flu-like symptoms    Patient is here with fever, nasal congestion, sore throat, cough, some nausea and vomiting. She is afebrile in the emergency department. She is tachycardic with heart rate of 110s. She reports shortness of breath for get an EKG. Oxygen saturation is 90-95% on room air. She is coughing. Wheezes noted on exam. Will start on breathing treatments, prednisone, chest x-ray is negative labs unremarkable. Will give IV bolus for tachycardia and dehydration.  Pt states she is feeling slightly better. desatting into 80s however. Still coughing and wheezing. More nebs ordered. Pt is tachycardic in 110s. Lactic acid elevated. Will need admission. Spoke with [facility], will admit.   Filed Vitals:   10/09/14 1915 10/09/14 1930 10/09/14 2045 10/09/14 2100  BP: 127/50 127/62 134/94 139/62  Pulse: 125 107 115 110  Temp:      Resp: Height:      Weight:      SpO2: 90% 95% 91% 94%      Jaynie Crumble, PA-C 10/09/14 2228  Linwood Dibbles, MD 10/10/14 [phone]

## 2014-10-09 NOTE — ED Notes (Signed)
Pt saturations dropped to 85%, pt took deep breaths, sats went up to 97%. Pt resting and sats dropped back down to 85%. [place] okay to not ambulate patient, pt placed on 2L [place]. 98% spo2 at this time.

## 2014-10-09 NOTE — ED Notes (Signed)
Pt states that she has a headache, sore throat, fever, cough, nasal drainage since Sunday. Pt states that she has had difficulty sleeping and N/V.

## 2014-10-10 ENCOUNTER — Other Ambulatory Visit: Payer: Self-pay | Admitting: Licensed Clinical Social Worker

## 2014-10-10 ENCOUNTER — Encounter (HOSPITAL_COMMUNITY): Payer: Self-pay | Admitting: General Practice

## 2014-10-10 DIAGNOSIS — J9601 Acute respiratory failure with hypoxia: Secondary | ICD-10-CM

## 2014-10-10 LAB — COMPREHENSIVE METABOLIC PANEL
ALBUMIN: 2.9 g/dL — AB (ref 3.5–5.2)
ALT: 18 U/L (ref 0–35)
AST: 40 U/L — AB (ref 0–37)
Alkaline Phosphatase: 85 U/L (ref 39–117)
Anion gap: 12 (ref 5–15)
BILIRUBIN TOTAL: 0.7 mg/dL (ref 0.3–1.2)
BUN: 10 mg/dL (ref 6–23)
CALCIUM: 8.4 mg/dL (ref 8.4–10.5)
CHLORIDE: 93 mmol/L — AB (ref 96–112)
CO2: 23 mmol/L (ref 19–32)
Creatinine, Ser: 0.76 mg/dL (ref 0.50–1.10)
GFR calc Af Amer: >90 mL/min (ref >90)
GFR calc non Af Amer: 84 mL/min — ABNORMAL LOW (ref >90)
Glucose, Bld: 125 mg/dL — ABNORMAL HIGH (ref 70–99)
Potassium: 3.3 mmol/L — ABNORMAL LOW (ref 3.5–5.1)
SODIUM: 128 mmol/L — AB (ref 135–145)
Total Protein: 5.7 g/dL — ABNORMAL LOW (ref 6.0–8.3)

## 2014-10-10 LAB — LACTIC ACID, PLASMA: LACTIC ACID, VENOUS: 1.6 mmol/L (ref 0.5–2.0)

## 2014-10-10 LAB — CBC WITH DIFFERENTIAL/PLATELET
BASOS PCT: 0 % (ref 0–1)
Basophils Absolute: 0 10*3/uL (ref 0.0–0.1)
EOS ABS: 0 10*3/uL (ref 0.0–0.7)
Eosinophils Relative: 0 % (ref 0–5)
HCT: 33.7 % — ABNORMAL LOW (ref 36.0–46.0)
Hemoglobin: 11.6 g/dL — ABNORMAL LOW (ref 12.0–15.0)
Lymphocytes Relative: 7 % — ABNORMAL LOW (ref 12–46)
Lymphs Abs: 0.5 10*3/uL — ABNORMAL LOW (ref 0.7–4.0)
MCH: 31.7 pg (ref 26.0–34.0)
MCHC: 34.4 g/dL (ref 30.0–36.0)
MCV: 92.1 fL (ref 78.0–100.0)
MONO ABS: 0.6 10*3/uL (ref 0.1–1.0)
Monocytes Relative: 8 % (ref 3–12)
NEUTROS PCT: 85 % — AB (ref 43–77)
Neutro Abs: 6.1 10*3/uL (ref 1.7–7.7)
PLATELETS: 217 10*3/uL (ref 150–400)
RBC: 3.66 MIL/uL — ABNORMAL LOW (ref 3.87–5.11)
RDW: 12.4 % (ref 11.5–15.5)
WBC: 7.2 10*3/uL (ref 4.0–10.5)

## 2014-10-10 LAB — INFLUENZA PANEL BY PCR (TYPE A & B)
H1N1 flu by pcr: NOT DETECTED
INFLAPCR: NEGATIVE
INFLBPCR: NEGATIVE

## 2014-10-10 LAB — STREP PNEUMONIAE URINARY ANTIGEN: Strep Pneumo Urinary Antigen: NEGATIVE

## 2014-10-10 MED ORDER — SODIUM CHLORIDE 0.9 % IV SOLN
INTRAVENOUS | Status: DC
  Administered 2014-10-10 – 2014-10-11 (×2): via INTRAVENOUS

## 2014-10-10 MED ORDER — METHYLPHENIDATE HCL 5 MG PO TABS: 30.0000 mg | ORAL_TABLET | ORAL | Status: DC

## 2014-10-10 MED ORDER — METHYLPHENIDATE HCL 5 MG PO TABS
20.0000 mg | ORAL_TABLET | ORAL | Status: DC
  Administered 2014-10-10 – 2014-10-13 (×4): 20 mg via ORAL
  Filled 2014-10-10 (×3): qty 4

## 2014-10-10 MED ORDER — HYDROCOD POLST-CHLORPHEN POLST 10-8 MG/5ML PO LQCR
5.0000 mL | Freq: Two times a day (BID) | ORAL | Status: DC
  Administered 2014-10-10 – 2014-10-13 (×7): 5 mL via ORAL
  Filled 2014-10-10 (×8): qty 5

## 2014-10-10 MED ORDER — TOPIRAMATE 25 MG PO TABS
25.0000 mg | ORAL_TABLET | Freq: Three times a day (TID) | ORAL | Status: DC
  Administered 2014-10-10 – 2014-10-13 (×8): 25 mg via ORAL
  Filled 2014-10-10 (×11): qty 1

## 2014-10-10 MED ORDER — POTASSIUM CHLORIDE ER 10 MEQ PO TBCR
30.0000 meq | EXTENDED_RELEASE_TABLET | Freq: Three times a day (TID) | ORAL | Status: DC
  Administered 2014-10-10 – 2014-10-13 (×7): 30 meq via ORAL
  Filled 2014-10-10 (×12): qty 3

## 2014-10-10 MED ORDER — IPRATROPIUM-ALBUTEROL 0.5-2.5 (3) MG/3ML IN SOLN
3.0000 mL | Freq: Four times a day (QID) | RESPIRATORY_TRACT | Status: DC | PRN
  Administered 2014-10-10 – 2014-10-13 (×4): 3 mL via RESPIRATORY_TRACT
  Filled 2014-10-10 (×4): qty 3

## 2014-10-10 MED ORDER — SODIUM CHLORIDE 0.9 % IV BOLUS (SEPSIS)
1000.0000 mL | Freq: Once | INTRAVENOUS | Status: AC
  Administered 2014-10-10: 1000 mL via INTRAVENOUS

## 2014-10-10 NOTE — Patient Instructions (Signed)
Client to cooperate with care providers at [facility] in [place].  Client to contact [employer] [facility] upon discharge home from hospital to discuss home health aide needs of client with [employer] [facility].

## 2014-10-10 NOTE — Patient Outreach (Signed)
Previous documentation in [facility] record.  CSW called home phone number of  client on 10/10/14 to assess needs of client.  CSW was not able to speak with client but did speak with Mr. Tawny Asalindal, spouse of client, on 10/10/14.  CSW verified identity of Mr. Mode.  Mr. Tawny Asalindal reported to CSW on 10/10/14 that client was currently a patient at [facility] in [place].  Mr. Tawny Asalindal said client admitted to that hospital on 10/09/14 and he hoped that she may be able to return home soon. He said he did not know a definite discharge date for client from the hospital at present..  He said client had recently received in home assistance through [employer] [facility].  He said that when client is discharged home, that she probably will call [employer] agency again to see if that agency can continue to provide in home aide support as needed for client.  He said that client had been pleased with in home support received through [employer] agency.  CSW encouraged client/spouse of client to contact CSW at [phone] as needed to discuss clinical social work needs of client.  CSW thanked Mr. Oddo for phone conversation on 10/10/14.  Plan: Client to cooperate with care providers at [facility] in [place]. CSW to call client in two weeks to assess needs of client at that time.   Kelton PillarMichael S.Dekendrick Uzelac MSW, LCSW Licensed Clinical Social Worker [facility] [phone]

## 2014-10-10 NOTE — Progress Notes (Signed)
[facility] PROGRESS NOTE  Assessment/Plan: Acute respiratory failure with hypoxia due to  *COPD exacerbation: - Expiratory wheeze have improved, will continue IV Levaquin, IV steroids and inhaler. - Influenza PCR is pending continue Tamiflu. - Continue cough medications.  Elevated lactic acid: - I will go ahead and give her a liter bolus of normal saline continue IV fluids. - Continue to try lactic acid slightly improved  Chronic pain management/Fibromyalgia/ IC (interstitial cystitis): Continue Cymbalta, fentanyl patch are seen, meloxicam.  Essential hypertension: Currently stable continue current home meds.  Hypothyroidism: Continue Synthroid.     Code Status: full Family Communication: none  Disposition Plan: inpatient   Consultants:  none  Procedures:  CXR  Antibiotics:  levaquin 3.30.2016  HPI/Subjective: She relates she continues to cough and her shortness of breath has not improved.  Objective: Filed Vitals:   10/09/14 2045 10/09/14 2100 10/09/14 2228 10/10/14 0413  BP: 134/94 139/62 139/53 121/58  Pulse: 115 110 106 90  Temp:   98.6 F (37 C) 97.7 F (36.5 C)  TempSrc:   Oral Oral  Resp: 22 18 20 18   Height:   4\' 10"  (1.473 m)   Weight:   79.606 kg (175 lb 8 oz) 80.06 kg (176 lb 8 oz)  SpO2: 91% 94% 94% 97%    Intake/Output Summary (Last 24 hours) at 10/10/14 0918 Last data filed at 10/10/14 0856  Gross per 24 hour  Intake      0 ml  Output   1000 ml  Net  -1000 ml   Filed Weights   10/09/14 1531 10/09/14 2228 10/10/14 0413  Weight: 77.111 kg (170 lb) 79.606 kg (175 lb 8 oz) 80.06 kg (176 lb 8 oz)    Exam:  General: Alert, awake, oriented x3, in no acute distress.  HEENT: No bruits, no goiter.  Heart: Regular rate and rhythm. Lungs: Good air movement, clear to auscultation. Abdomen: Soft, nontender, nondistended, positive bowel sounds.  Neuro: Grossly intact, nonfocal.   Data Reviewed: Basic Metabolic  Panel:  Recent Labs Lab 10/09/14 1543  NA 127*  K 3.6  CL 89*  CO2 26  GLUCOSE 124*  BUN 11  CREATININE 0.82  CALCIUM 8.9   Liver Function Tests:  Recent Labs Lab 10/09/14 1543  AST 25  ALT 17  ALKPHOS 101  BILITOT 0.5  PROT 6.5  ALBUMIN 3.6   No results for input(s): LIPASE, AMYLASE in the last 168 hours. No results for input(s): AMMONIA in the last 168 hours. CBC:  Recent Labs Lab 10/09/14 1543  WBC 8.9  NEUTROABS 7.4  HGB 12.8  HCT 37.5  MCV 93.3  PLT 212   Cardiac Enzymes: No results for input(s): CKTOTAL, CKMB, CKMBINDEX, TROPONINI in the last 168 hours. BNP (last 3 results) No results for input(s): BNP in the last 8760 hours.  ProBNP (last 3 results) No results for input(s): PROBNP in the last 8760 hours.  CBG: No results for input(s): GLUCAP in the last 168 hours.  No results found for this or any previous visit (from the past 240 hour(s)).   Studies: Dg Chest 2 View  10/09/2014   CLINICAL DATA:  Cough and short of breath  EXAM: CHEST  2 VIEW  COMPARISON:  Radiograph 07/10/2014  FINDINGS: Anterior posterior cervical fusion noted. Posterior lumbar fusion noted.  Normal cardiac silhouette. Chronic bronchitic markings. No effusion, infiltrate, or pneumothorax.  Severe degenerative change in the right shoulder.  IMPRESSION: Chronic bronchitic markings.  No acute findings.   Electronically  Signed   By: Genevive Bi M.D.   On: 10/09/2014 16:37    Scheduled Meds: . acyclovir  400 mg Oral BID  . amLODipine  5 mg Oral Daily  . aspirin EC  81 mg Oral QHS  . benzonatate  100 mg Oral TID  . buPROPion  300 mg Oral QHS  . DULoxetine  60 mg Oral Daily  . fentaNYL  75 mcg Transdermal Q72H  . guaiFENesin  600 mg Oral BID  . heparin  5,000 Units Subcutaneous 3 times per day  . irbesartan  300 mg Oral Daily  . levofloxacin (LEVAQUIN) IV  750 mg Intravenous QHS  . levothyroxine  75 mcg Oral QAC breakfast  . magnesium hydroxide  15 mL Oral QHS  .  meloxicam  15 mg Oral QHS  . methylphenidate  50 mg Oral BH-q7a  . methylPREDNISolone (SOLU-MEDROL) injection  60 mg Intravenous Q12H  . montelukast  10 mg Oral QHS  . pantoprazole  40 mg Oral Daily  . pentosan polysulfate  200 mg Oral BID  . sodium chloride  1,000 mL Intravenous Once  . topiramate  50 mg Oral TID  . triamterene-hydrochlorothiazide  1 tablet Oral q morning - 10a   Continuous Infusions: . sodium chloride 75 mL/hr at 10/09/14 2340     FELIZ Rosine Beat  [facility] Pager [phone]. If 7PM-7AM, please contact night-coverage at [email or web address], password [facility] 10/10/2014, 9:18 AM  LOS: 1 day

## 2014-10-10 NOTE — Care Management Note (Unsigned)
    Page 1 of 1   10/10/2014     4:03:06 PM CARE MANAGEMENT NOTE 10/10/2014  Patient:  Kristina Horton,Kristina Horton   Account Number:  [id number]  Date Initiated:  10/10/2014  Documentation initiated by:  Felina Tello  Subjective/Objective Assessment:   Pt adm on 10/10/14 with COPD exacerbation, fever.  PTA, pt resides at home with spouse.     Action/Plan:   Will follow for dc needs as pt progresses.   Anticipated DC Date:  10/13/2014   Anticipated DC Plan:  HOME W HOME HEALTH SERVICES      DC Planning Services  CM consult      Choice offered to / List presented to:             Status of service:  In process, will continue to follow Medicare Important Message given?   (If response is "NO", the following Medicare IM given date fields will be blank) Date Medicare IM given:   Medicare IM given by:   Date Additional Medicare IM given:   Additional Medicare IM given by:    Discharge Disposition:    Per UR Regulation:  Reviewed for med. necessity/level of care/duration of stay  If discussed at Long Length of Stay Meetings, dates discussed:    Comments:

## 2014-10-11 LAB — BASIC METABOLIC PANEL
Anion gap: 5 (ref 5–15)
BUN: 9 mg/dL (ref 6–23)
CALCIUM: 8.2 mg/dL — AB (ref 8.4–10.5)
CO2: 25 mmol/L (ref 19–32)
CREATININE: 0.67 mg/dL (ref 0.50–1.10)
Chloride: 105 mmol/L (ref 96–112)
GFR calc Af Amer: >90 mL/min (ref >90)
GFR calc non Af Amer: 88 mL/min — ABNORMAL LOW (ref >90)
Glucose, Bld: 132 mg/dL — ABNORMAL HIGH (ref 70–99)
Potassium: 3.2 mmol/L — ABNORMAL LOW (ref 3.5–5.1)
Sodium: 135 mmol/L (ref 135–145)

## 2014-10-11 LAB — LEGIONELLA ANTIGEN, URINE

## 2014-10-11 MED ORDER — MAGNESIUM OXIDE (LAXATIVE) 500 MG PO TABS
2.0000 | ORAL_TABLET | Freq: Every day | ORAL | Status: DC
  Administered 2014-10-12: 1000 mg via ORAL

## 2014-10-11 MED ORDER — HYPROMELLOSE 0.3 % OP SOLN: 1.0000 [drp] | OPHTHALMIC | Status: DC | PRN

## 2014-10-11 MED ORDER — SODIUM CHLORIDE 0.9 % IV BOLUS (SEPSIS)
1000.0000 mL | Freq: Once | INTRAVENOUS | Status: AC
  Administered 2014-10-11: 1000 mL via INTRAVENOUS

## 2014-10-11 MED ORDER — GUAIFENESIN-DM 100-10 MG/5ML PO SYRP
5.0000 mL | ORAL_SOLUTION | ORAL | Status: DC | PRN
  Administered 2014-10-11 – 2014-10-13 (×6): 5 mL via ORAL
  Filled 2014-10-11 (×6): qty 5

## 2014-10-11 MED ORDER — POTASSIUM CHLORIDE CRYS ER 20 MEQ PO TBCR
40.0000 meq | EXTENDED_RELEASE_TABLET | Freq: Two times a day (BID) | ORAL | Status: AC
  Administered 2014-10-11 (×2): 40 meq via ORAL
  Filled 2014-10-11: qty 2

## 2014-10-11 MED ORDER — BISACODYL 5 MG PO TBEC
5.0000 mg | DELAYED_RELEASE_TABLET | Freq: Every day | ORAL | Status: DC | PRN
  Administered 2014-10-11 – 2014-10-12 (×2): 10 mg via ORAL
  Filled 2014-10-11 (×2): qty 2

## 2014-10-11 MED ORDER — FENTANYL 25 MCG/HR TD PT72
75.0000 ug | MEDICATED_PATCH | TRANSDERMAL | Status: DC
  Administered 2014-10-11: 75 ug via TRANSDERMAL
  Filled 2014-10-11: qty 3

## 2014-10-11 MED ORDER — GENTEAL PM 85-15 % OP OINT
1.0000 "application " | TOPICAL_OINTMENT | Freq: Every day | OPHTHALMIC | Status: DC
  Administered 2014-10-12: 1 via OPHTHALMIC

## 2014-10-11 NOTE — Progress Notes (Signed)
I cosign all medication administration and documentation this shift by Komicia Jeffries student RN  

## 2014-10-11 NOTE — Progress Notes (Signed)
[facility] PROGRESS NOTE  Assessment/Plan: Acute respiratory failure with hypoxia due to  COPD exacerbation: - Expiratory wheeze have improved, will continue IV Levaquin, IV steroids and inhaler. - Influenza PCR is pending continue Tamiflu. - Continue cough medications.  Elevated lactic acid: - I will go ahead and give her a liter bolus of normal saline continue IV fluids. - Continue to try lactic acid slightly improved  Chronic pain management/Fibromyalgia/ IC (interstitial cystitis): Continue Cymbalta, fentanyl patch are seen, meloxicam.  Essential hypertension: Currently stable continue current home meds.  Hypothyroidism: Continue Synthroid.     Code Status: full Family Communication: none  Disposition Plan: inpatient   Consultants:  none  Procedures:  CXR  Antibiotics:  levaquin 3.30.2016  HPI/Subjective: Feels better but cough not imrpoved.  Objective: Filed Vitals:   10/10/14 2044 10/11/14 0523 10/11/14 0542 10/11/14 1006  BP: 101/56 156/66  142/70  Pulse: 97 90  85  Temp: 98.5 F (36.9 C) 97.7 F (36.5 C)  98.2 F (36.8 C)  TempSrc: Oral Oral  Oral  Resp: 18 18  18   Height:      Weight:   80.604 kg (177 lb 11.2 oz)   SpO2: 97% 100%  100%    Intake/Output Summary (Last 24 hours) at 10/11/14 1148 Last data filed at 10/11/14 0543  Gross per 24 hour  Intake 2821.67 ml  Output   3375 ml  Net -553.33 ml   Filed Weights   10/09/14 2228 10/10/14 0413 10/11/14 0542  Weight: 79.606 kg (175 lb 8 oz) 80.06 kg (176 lb 8 oz) 80.604 kg (177 lb 11.2 oz)    Exam:  General: Alert, awake, oriented x3, in no acute distress.  HEENT: No bruits, no goiter.  Heart: Regular rate and rhythm. Lungs: Good air movement, clear to auscultation. Abdomen: Soft, nontender, nondistended, positive bowel sounds.  Neuro: Grossly intact, nonfocal.   Data Reviewed: Basic Metabolic Panel:  Recent Labs Lab 10/09/14 1543 10/10/14 1005  NA 127* 128*  K  3.6 3.3*  CL 89* 93*  CO2 26 23  GLUCOSE 124* 125*  BUN 11 10  CREATININE 0.82 0.76  CALCIUM 8.9 8.4   Liver Function Tests:  Recent Labs Lab 10/09/14 1543 10/10/14 1005  AST 25 40*  ALT 17 18  ALKPHOS 101 85  BILITOT 0.5 0.7  PROT 6.5 5.7*  ALBUMIN 3.6 2.9*   No results for input(s): LIPASE, AMYLASE in the last 168 hours. No results for input(s): AMMONIA in the last 168 hours. CBC:  Recent Labs Lab 10/09/14 1543 10/10/14 1005  WBC 8.9 7.2  NEUTROABS 7.4 6.1  HGB 12.8 11.6*  HCT 37.5 33.7*  MCV 93.3 92.1  PLT 212 217   Cardiac Enzymes: No results for input(s): CKTOTAL, CKMB, CKMBINDEX, TROPONINI in the last 168 hours. BNP (last 3 results) No results for input(s): BNP in the last 8760 hours.  ProBNP (last 3 results) No results for input(s): PROBNP in the last 8760 hours.  CBG: No results for input(s): GLUCAP in the last 168 hours.  No results found for this or any previous visit (from the past 240 hour(s)).   Studies: Dg Chest 2 View  10/09/2014   CLINICAL DATA:  Cough and short of breath  EXAM: CHEST  2 VIEW  COMPARISON:  Radiograph 07/10/2014  FINDINGS: Anterior posterior cervical fusion noted. Posterior lumbar fusion noted.  Normal cardiac silhouette. Chronic bronchitic markings. No effusion, infiltrate, or pneumothorax.  Severe degenerative change in the right shoulder.  IMPRESSION: Chronic  bronchitic markings.  No acute findings.   Electronically Signed   By: Genevive Bi M.D.   On: 10/09/2014 16:37    Scheduled Meds: . acyclovir  400 mg Oral BID  . aspirin EC  81 mg Oral QHS  . benzonatate  100 mg Oral TID  . buPROPion  300 mg Oral QHS  . chlorpheniramine-HYDROcodone  5 mL Oral Q12H  . DULoxetine  60 mg Oral Daily  . fentaNYL  75 mcg Transdermal Q72H  . guaiFENesin  600 mg Oral BID  . heparin  5,000 Units Subcutaneous 3 times per day  . irbesartan  300 mg Oral Daily  . levofloxacin (LEVAQUIN) IV  750 mg Intravenous QHS  . levothyroxine  75  mcg Oral QAC breakfast  . magnesium hydroxide  15 mL Oral QHS  . meloxicam  15 mg Oral QHS  . methylphenidate  20 mg Oral Daily  . methylPREDNISolone (SOLU-MEDROL) injection  60 mg Intravenous Q12H  . montelukast  10 mg Oral QHS  . pantoprazole  40 mg Oral Daily  . pentosan polysulfate  200 mg Oral BID  . potassium chloride  30 mEq Oral TID  . topiramate  25 mg Oral TID   Continuous Infusions:     Marinda Elk  [facility] Pager [phone]. If 7PM-7AM, please contact night-coverage at [email or web address], password [facility] 10/11/2014, 11:48 AM  LOS: 2 days

## 2014-10-12 LAB — BASIC METABOLIC PANEL
Anion gap: 6 (ref 5–15)
BUN: 12 mg/dL (ref 6–23)
CO2: 25 mmol/L (ref 19–32)
CREATININE: 0.63 mg/dL (ref 0.50–1.10)
Calcium: 8.4 mg/dL (ref 8.4–10.5)
Chloride: 101 mmol/L (ref 96–112)
GFR calc Af Amer: >90 mL/min (ref >90)
GFR, EST NON AFRICAN AMERICAN: 89 mL/min — AB (ref >90)
GLUCOSE: 153 mg/dL — AB (ref 70–99)
POTASSIUM: 3.9 mmol/L (ref 3.5–5.1)
SODIUM: 132 mmol/L — AB (ref 135–145)

## 2014-10-12 MED ORDER — PREDNISONE 50 MG PO TABS
50.0000 mg | ORAL_TABLET | Freq: Every day | ORAL | Status: DC
  Administered 2014-10-12 – 2014-10-13 (×2): 50 mg via ORAL
  Filled 2014-10-12 (×2): qty 1

## 2014-10-12 MED ORDER — LEVOFLOXACIN 750 MG PO TABS
750.0000 mg | ORAL_TABLET | Freq: Every day | ORAL | Status: DC
  Administered 2014-10-12 – 2014-10-13 (×2): 750 mg via ORAL
  Filled 2014-10-12 (×2): qty 1

## 2014-10-12 NOTE — Progress Notes (Signed)
[facility] PROGRESS NOTE  Assessment/Plan: Acute respiratory failure with hypoxia due to  COPD exacerbation: - Expiratory wheeze have improved, change Levaquin to oral, change steroids to oral. - Influenza PCR is negative - Continue cough medications.  Elevated lactic acid: - resolved with hydration.  Chronic pain management/Fibromyalgia/ IC (interstitial cystitis): Continue Cymbalta, fentanyl patch are seen, meloxicam.  Essential hypertension: Currently stable continue current home meds.  Hypothyroidism: Continue Synthroid.     Code Status: full Family Communication: none  Disposition Plan: inpatient   Consultants:  none  Procedures:  CXR  Antibiotics:  levaquin 3.30.2016  HPI/Subjective: cough not imrpoving  Objective: Filed Vitals:   10/11/14 1410 10/11/14 2151 10/12/14 0524 10/12/14 0534  BP: 159/69 121/82 159/79   Pulse: 104 90 93   Temp: 99.1 F (37.3 C) 98.6 F (37 C) 98.2 F (36.8 C)   TempSrc: Oral Oral Oral   Resp: Height:      Weight:    82.6 kg (182 lb 1.6 oz)  SpO2: 100% 99% 97%     Intake/Output Summary (Last 24 hours) at 10/12/14 1113 Last data filed at 10/12/14 0535  Gross per 24 hour  Intake   2347 ml  Output   2150 ml  Net    197 ml   Filed Weights   10/10/14 0413 10/11/14 0542 10/12/14 0534  Weight: 80.06 kg (176 lb 8 oz) 80.604 kg (177 lb 11.2 oz) 82.6 kg (182 lb 1.6 oz)    Exam:  General: Alert, awake, oriented x3, in no acute distress.  HEENT: No bruits, no goiter.  Heart: Regular rate and rhythm. Lungs: Good air movement, clear to auscultation. Abdomen: Soft, nontender, nondistended, positive bowel sounds.  Neuro: Grossly intact, nonfocal.   Data Reviewed: Basic Metabolic Panel:  Recent Labs Lab 10/09/14 1543 10/10/14 1005 10/11/14 1228 10/12/14 0331  NA 127* 128* 135 132*  K 3.6 3.3* 3.2* 3.9  CL 89* 93* 105 101  CO2 GLUCOSE 124* 125* 132* 153*  BUN CREATININE 0.82 0.76 0.67 0.63  CALCIUM 8.9 8.4 8.2* 8.4   Liver Function Tests:  Recent Labs Lab 10/09/14 1543 10/10/14 1005  AST 25 40*  ALT 17 18  ALKPHOS 101 85  BILITOT 0.5 0.7  PROT 6.5 5.7*  ALBUMIN 3.6 2.9*   No results for input(s): LIPASE, AMYLASE in the last 168 hours. No results for input(s): AMMONIA in the last 168 hours. CBC:  Recent Labs Lab 10/09/14 1543 10/10/14 1005  WBC 8.9 7.2  NEUTROABS 7.4 6.1  HGB 12.8 11.6*  HCT 37.5 33.7*  MCV 93.3 92.1  PLT 212 217   Cardiac Enzymes: No results for input(s): CKTOTAL, CKMB, CKMBINDEX, TROPONINI in the last 168 hours. BNP (last 3 results) No results for input(s): BNP in the last 8760 hours.  ProBNP (last 3 results) No results for input(s): PROBNP in the last 8760 hours.  CBG: No results for input(s): GLUCAP in the last 168 hours.  No results found for this or any previous visit (from the past 240 hour(s)).   Studies: No results found.  Scheduled Meds: . acyclovir  400 mg Oral BID  . aspirin EC  81 mg Oral QHS  . benzonatate  100 mg Oral TID  . buPROPion  300 mg Oral QHS  . chlorpheniramine-HYDROcodone  5 mL Oral Q12H  . DULoxetine  60 mg Oral Daily  . fentaNYL  75 mcg Transdermal  Q72H  . GENTEAL PM  1 application Both Eyes QHS  . heparin  5,000 Units Subcutaneous 3 times per day  . irbesartan  300 mg Oral Daily  . levofloxacin  750 mg Oral Daily  . levothyroxine  75 mcg Oral QAC breakfast  . Magnesium Oxide  2 tablet Oral QHS  . meloxicam  15 mg Oral QHS  . methylphenidate  20 mg Oral Daily  . montelukast  10 mg Oral QHS  . pantoprazole  40 mg Oral Daily  . pentosan polysulfate  200 mg Oral BID  . potassium chloride  30 mEq Oral TID  . [START ON 10/13/2014] predniSONE  50 mg Oral Q breakfast  . topiramate  25 mg Oral TID   Continuous Infusions:     Kristina Horton, Kristina Horton  [facility] Pager [phone]. If 7PM-7AM, please contact night-coverage at [email or web address], password  [email or web address] 10/12/2014, 11:13 AM  LOS: 3 days

## 2014-10-12 NOTE — Progress Notes (Signed)
Patient has multiple requests, especially for cough medicine, pain medicine, and anxiety medications.  Have given her PRNs throughout the evening.  Patient resting.

## 2014-10-12 NOTE — Progress Notes (Signed)
PT Cancellation Note  Patient Details Name: Kristina Horton MRN: [id number] DOB: [birth year]   Cancelled Treatment:    Reason Eval/Treat Not Completed: Medical issues which prohibited therapy. Pt up all night with cough and illness, nursing aware and agreed she is not going to be able to tolerate tx.   Ivar DrapeStout, [name] E 10/12/2014, 10:46 AM   Samul Dadauth Amna Welker, PT MS Acute Rehab Dept. Number: [phone]

## 2014-10-13 MED ORDER — LEVOFLOXACIN 750 MG PO TABS: 750.0000 mg | ORAL_TABLET | Freq: Every day | ORAL | Status: DC

## 2014-10-13 MED ORDER — PREDNISONE 10 MG PO TABS: ORAL_TABLET | ORAL | Status: DC

## 2014-10-13 MED ORDER — GUAIFENESIN-DM 100-10 MG/5ML PO SYRP: 5.0000 mL | ORAL_SOLUTION | ORAL | Status: DC | PRN

## 2014-10-13 NOTE — Discharge Summary (Signed)
Physician Discharge Summary  Kristina Horton UEA:[id number] DOB: 09/17/1945 DOA: 10/09/2014  PCP: Fredirick Maudlin, MD  Admit date: 10/09/2014 Discharge date: 10/13/2014  Time spent: 35 minutes  Recommendations for Outpatient Follow-up:  1. Follow up with PCP in 2 weeks.  Discharge Diagnoses:  Principal Problem:   COPD exacerbation Active Problems:   IC (interstitial cystitis)   Essential hypertension, benign   Fibromyalgia   Allergic rhinitis   Iron deficiency anemia   Gastroparesis   Acute respiratory failure with hypoxia   Discharge Condition: stable  Diet recommendation: heart healthy  Filed Weights   10/11/14 0542 10/12/14 0534 10/13/14 0559  Weight: 80.604 kg (177 lb 11.2 oz) 82.6 kg (182 lb 1.6 oz) 81.6 kg (179 lb 14.3 oz)    History of present illness:  69 y.o. female with Past medical history of essential hypertension, irritable bowel syndrome, interstitial cystitis, fibromyalgia, gastroparesis, COPD. The patient is presenting with complaints of cough and shortness of breath progressively worsening since Sunday. Patient mentions that last week she had sick exposure to her grandkids as well as one of the providers. Over the weekend she started having progressively worsening cough with greenish expectoration. She denies any blood in the sputum. Later on she also started noticing fever without chills. She complains of generalized fatigue and will malaise that has been progressively worsening. She also does of some myalgia.  Hospital Course:  Acute respiratory failure with hypoxia due to COPD exacerbation: - Start on IV steroids, antibiotics and inhalers. - Expiratory wheeze have improved, change Levaquin to oral, change steroids to oral. - Influenza PCR is negative - Continue Levaquin for 7 days and a steroid taper.  Elevated lactic acid: - resolved with hydration.  Chronic pain management/Fibromyalgia/ IC (interstitial cystitis): Continue Cymbalta, fentanyl patch  are seen, meloxicam.  Essential hypertension: Currently stable continue current home meds.  Hypothyroidism: Continue Synthroid.  Procedures:  Chest x-ray  Consultations:  none  Discharge Exam: Filed Vitals:   10/13/14 0559  BP: 168/91  Pulse: 82  Temp: 98.7 F (37.1 C)  Resp: 18    General: A&O x3 Cardiovascular: RRR Respiratory: good air movement CTA B/L  Discharge Instructions   Discharge Instructions    Diet - low sodium heart healthy    Complete by:  As directed      Increase activity slowly    Complete by:  As directed           Current Discharge Medication List    START taking these medications   Details  guaiFENesin-dextromethorphan (ROBITUSSIN DM) 100-10 MG/5ML syrup Take 5 mLs by mouth every 4 (four) hours as needed for cough. Qty: 118 mL, Refills: 0    levofloxacin (LEVAQUIN) 750 MG tablet Take 1 tablet (750 mg total) by mouth daily. Qty: 4 tablet, Refills: 0    predniSONE (DELTASONE) 10 MG tablet Takes 6 tablets for 1 days, then 5 tablets for 1 days, then 4 tablets for 1 days, then 3 tablets for 1 days, then 2 tabs for 1 days, then 1 tab for 1 days, and then stop. Qty: 21 tablet, Refills: 0      CONTINUE these medications which have NOT CHANGED   Details  acyclovir (ZOVIRAX) 400 MG tablet Take 400 mg by mouth 2 (two) times daily.      ALPRAZolam (XANAX) 0.25 MG tablet Take 0.25 mg by mouth 3 (three) times daily as needed. And every night.    amLODipine (NORVASC) 5 MG tablet Take 5 mg by mouth daily.  aspirin EC 81 MG tablet Take 81 mg by mouth at bedtime.    bisacodyl (BISACODYL) 5 MG EC tablet Take 5 mg by mouth daily as needed. For stool softener    buPROPion (WELLBUTRIN XL) 300 MG 24 hr tablet Take 300 mg by mouth at bedtime.     cetirizine (ZYRTEC) 10 MG tablet Take 10 mg by mouth at bedtime.     Cholecalciferol (VITAMIN D-3) 1000 UNITS CAPS Take 1,000 Units by mouth at bedtime.     clidinium-chlordiazePOXIDE (LIBRAX) 5-2.5 MG  per capsule Take 1 capsule by mouth 2 (two) times daily as needed (for chafing).    Diclofenac Sodium 1.5 % SOLN Place 40 drops onto the skin 4 (four) times daily as needed (pain).     DULoxetine (CYMBALTA) 60 MG capsule Take 60 mg by mouth daily.     fentaNYL (DURAGESIC - DOSED MCG/HR) 75 MCG/HR Place 75 mcg onto the skin every 3 (three) days.     fluticasone (CUTIVATE) 0.05 % cream Apply 1 application topically 2 (two) times daily.     HYDROcodone-homatropine (HYCODAN) 5-1.5 MG/5ML syrup Take 5 mLs by mouth every 4 (four) hours as needed. For cough    ketoconazole (NIZORAL) 2 % cream Apply 1 application topically 2 (two) times daily.     levalbuterol (XOPENEX HFA) 45 MCG/ACT inhaler Inhale 2 puffs into the lungs 3 (three) times daily.    levothyroxine (SYNTHROID, LEVOTHROID) 75 MCG tablet Take 75 mcg by mouth every morning.     Lidocaine, Anorectal, 5 % CREA Apply topically daily as needed.    Magnesium Hydroxide (PHILLIPS MILK OF MAGNESIA PO) Take 1-2 capsules by mouth at bedtime.     meloxicam (MOBIC) 15 MG tablet Take 15 mg by mouth at bedtime.     methylphenidate (RITALIN) 20 MG tablet Take 50 mg by mouth every morning.     montelukast (SINGULAIR) 10 MG tablet Take 10 mg by mouth at bedtime.    ondansetron (ZOFRAN-ODT) 8 MG disintegrating tablet Take 8 mg by mouth every 6 (six) hours as needed for nausea or vomiting.     oxycodone (OXY-IR) 5 MG capsule Take 10 mg by mouth every 4 (four) hours as needed. For break through pain   Associated Diagnoses: Iron deficiency anemia    pentosan polysulfate (ELMIRON) 100 MG capsule Take 200 mg by mouth 2 (two) times daily.     potassium chloride (KLOR-CON) 10 MEQ CR tablet Take 30 mEq by mouth 3 (three) times daily. Patient takes up to 70-80 meq daily    Probiotic Product (ACIDOPHILUS/GOAT MILK) CAPS Take 1 tablet by mouth at bedtime. Ultimate Flora    RABEprazole (ACIPHEX) 20 MG tablet Take 20 mg by mouth 2 (two) times daily. 40 mg  in the morning and 20 mg at night.    sodium chloride (OCEAN) 0.65 % nasal spray Place 1 spray into the nose as needed. For nasal decongestion    talc powder Apply 1 application topically as needed.    topiramate (TOPAMAX) 50 MG tablet Take 50 mg by mouth 3 (three) times daily.     triamcinolone (NASACORT) 55 MCG/ACT nasal inhaler Place 2 sprays into the nose as needed.    triamterene-hydrochlorothiazide (MAXZIDE) 75-50 MG per tablet Take 1 tablet by mouth every morning.     valsartan (DIOVAN) 320 MG tablet Take 320 mg by mouth daily.    EPINEPHrine (EPIPEN 2-PAK) 0.3 mg/0.3 mL DEVI Inject 0.3 mg into the muscle once. To food allergies, delayed reaction  hydrOXYzine (VISTARIL) 25 MG capsule Take 1 capsule (25 mg total) by mouth 3 (three) times daily as needed. Qty: 30 capsule, Refills: 0   Associated Diagnoses: Rash      STOP taking these medications     guaiFENesin (MUCINEX) 600 MG 12 hr tablet      ciprofloxacin (CIPRO) 500 MG tablet        Allergies  Allergen Reactions  . D-Xylitol Other (See Comments)    "causes the inside of the mouth to peel"  . Fluconazole     Can take brand name Diflucan not generic  . Iodine Rash    Unknown  . Latex Itching and Rash    Takes her skin off.  . Meperidine Nausea And Vomiting  . Other Other (See Comments)    Blueberries, "DIAL SOAP"  . Selenium Nausea And Vomiting  . Shellfish-Derived Products Nausea And Vomiting and Other (See Comments)    Other reaction(s): Unknown  . Sulfa Antibiotics Hives and Other (See Comments)    Flu-like symptoms  . Tape Itching    Skin irritation  Takes skin off, needs "paper" tape.  . Demerol Nausea And Vomiting  . Sulfa Drugs Cross Reactors Nausea And Vomiting    Fever, welts, hives, flu like  . Cleocin  [Clindamycin Hcl] Nausea Only  . Tizanidine Other (See Comments)    Confusion hallucinations      The results of significant diagnostics from this hospitalization (including imaging,  microbiology, ancillary and laboratory) are listed below for reference.    Significant Diagnostic Studies: Dg Chest 2 View  10/09/2014   CLINICAL DATA:  Cough and short of breath  EXAM: CHEST  2 VIEW  COMPARISON:  Radiograph 07/10/2014  FINDINGS: Anterior posterior cervical fusion noted. Posterior lumbar fusion noted.  Normal cardiac silhouette. Chronic bronchitic markings. No effusion, infiltrate, or pneumothorax.  Severe degenerative change in the right shoulder.  IMPRESSION: Chronic bronchitic markings.  No acute findings.   Electronically Signed   By: Genevive Bi M.D.   On: 10/09/2014 16:37    Microbiology: No results found for this or any previous visit (from the past 240 hour(s)).   Labs: Basic Metabolic Panel:  Recent Labs Lab 10/09/14 1543 10/10/14 1005 10/11/14 1228 10/12/14 0331  NA 127* 128* 135 132*  K 3.6 3.3* 3.2* 3.9  CL 89* 93* 105 101  CO2 GLUCOSE 124* 125* 132* 153*  BUN CREATININE 0.82 0.76 0.67 0.63  CALCIUM 8.9 8.4 8.2* 8.4   Liver Function Tests:  Recent Labs Lab 10/09/14 1543 10/10/14 1005  AST 25 40*  ALT 17 18  ALKPHOS 101 85  BILITOT 0.5 0.7  PROT 6.5 5.7*  ALBUMIN 3.6 2.9*   No results for input(s): LIPASE, AMYLASE in the last 168 hours. No results for input(s): AMMONIA in the last 168 hours. CBC:  Recent Labs Lab 10/09/14 1543 10/10/14 1005  WBC 8.9 7.2  NEUTROABS 7.4 6.1  HGB 12.8 11.6*  HCT 37.5 33.7*  MCV 93.3 92.1  PLT 212 217   Cardiac Enzymes: No results for input(s): CKTOTAL, CKMB, CKMBINDEX, TROPONINI in the last 168 hours. BNP: BNP (last 3 results) No results for input(s): BNP in the last 8760 hours.  ProBNP (last 3 results) No results for input(s): PROBNP in the last 8760 hours.  CBG: No results for input(s): GLUCAP in the last 168 hours.     Signed:  Marinda Elk  [facility] 10/13/2014, 10:53 AM

## 2014-10-13 NOTE — Evaluation (Signed)
Physical Therapy Evaluation and Discharge Patient Details Name: Kristina Horton MRN: [id number] DOB: 1945/10/22 Today's Date: 10/13/2014   History of Present Illness  69 y.o. female admitted with COPD exacerbation.  Clinical Impression  Patient evaluated by Physical Therapy with no further acute PT needs identified. All education has been completed and the patient has no further questions. Ambulates generally well up to 190 feet. Mild dyspnea with SpO2 90-95%, improving with cues for pursed lip breathing. States she feels confident with her ability to mobilize safely. Reports no falls in the past 6 months.See below for any follow-up Physial Therapy or equipment needs. PT is signing off. Thank you for this referral.     Follow Up Recommendations No PT follow up    Equipment Recommendations  None recommended by PT    Recommendations for Other Services       Precautions / Restrictions Precautions Precautions: None Restrictions Weight Bearing Restrictions: No      Mobility  Bed Mobility               General bed mobility comments: In chair  Transfers Overall transfer level: Modified independent               General transfer comment: Performed from toilet and recliner without assist.  Ambulation/Gait Ambulation/Gait assistance: Supervision Ambulation Distance (Feet): 190 Feet Assistive device: Rolling walker (2 wheeled) Gait Pattern/deviations: Step-through pattern;Decreased stride length;Decreased dorsiflexion - left Gait velocity: decreased   General Gait Details: Educated on safe DME use with a rolling walker. SpO2 90-95% on room air with cues for pursed lip breathing. mild dyspnea, no loss of balance noted. Supervision provided for safety. Cues for improved Lt foot clearance during swing through phase of gait cycle.  Stairs            Wheelchair Mobility    Modified Rankin (Stroke Patients Only)       Balance Overall balance assessment: Needs  assistance Sitting-balance support: No upper extremity supported;Feet supported Sitting balance-Leahy Scale: Good     Standing balance support: No upper extremity supported Standing balance-Leahy Scale: Fair                               Pertinent Vitals/Pain Pain Assessment: 0-10 Pain Score:  ("I'm always in pain" no value given) Pain Descriptors / Indicators: Constant Pain Intervention(s): Monitored during session;Repositioned    Home Living Family/patient expects to be discharged to:: Private residence Living Arrangements: Spouse/significant other Available Help at Discharge: Family;Available 24 hours/day;Personal care attendant (husband not in good health - has a CNA visit 3x/week 4hr day) Type of Home: House Home Access: Ramped entrance     Home Layout: One level Home Equipment: Walker - 2 wheels;Walker - 4 wheels;Cane - single point;Bedside commode;Shower seat;Wheelchair - IT trainermanual;Electric scooter      Prior Function Level of Independence: Needs assistance   Gait / Transfers Assistance Needed: uses rollator for mobility  ADL's / Homemaking Assistance Needed: Husband assists with dressing and drying after a bath.        Hand Dominance   Dominant Hand: Right    Extremity/Trunk Assessment   Upper Extremity Assessment: Defer to OT evaluation           Lower Extremity Assessment: Generalized weakness         Communication   Communication: No difficulties  Cognition Arousal/Alertness: Awake/alert Behavior During Therapy: WFL for tasks assessed/performed Overall Cognitive Status: Within Functional Limits for tasks  assessed                      General Comments General comments (skin integrity, edema, etc.): Pt states she feels confident with her abilites and has no further concers regarding her mobility.    Exercises        Assessment/Plan    PT Assessment Patent does not need any further PT services  PT Diagnosis Abnormality  of gait;Generalized weakness   PT Problem List    PT Treatment Interventions     PT Goals (Current goals can be found in the Care Plan section) Acute Rehab PT Goals Patient Stated Goal: Go home, start aquatic therapy for my shoulder PT Goal Formulation: All assessment and education complete, DC therapy    Frequency     Barriers to discharge        Co-evaluation               End of Session   Activity Tolerance: Patient tolerated treatment well Patient left: in chair;with call bell/phone within reach Nurse Communication: Mobility status         Time: 2440-1027 PT Time Calculation (min) (ACUTE ONLY): 26 min   Charges:   PT Evaluation $Initial PT Evaluation Tier I: 1 Procedure PT Treatments $Gait Training: 8-22 mins   PT G CodesBerton Mount 10/13/2014, 1:29 PM Sunday [place], [place] [phone]

## 2014-10-21 ENCOUNTER — Encounter: Payer: Self-pay | Admitting: *Deleted

## 2014-10-21 ENCOUNTER — Other Ambulatory Visit: Payer: Self-pay | Admitting: *Deleted

## 2014-10-21 NOTE — Patient Outreach (Signed)
[facility]) Care Management   10/21/2014  Kristina Horton [birth year] [id number]  Kristina Horton is an 69 y.o. female  Subjective: "I just feel awful. I'm coughing up green stuff. I've had a fever. I just feel awful!"  Objective:  [name] was previously followed by [place] [facility] but was discharged when she met her case management goals.   I made a visit to [name] today in response to her husband reaching out because of concerns about new symptoms. [name] has COPD and was recently discharged from the hospital for an exacerbation. She completed a course of oral Levaquin and oral steroids on Saturday. [name] reports elevated temperature of 99.5 and greater intermittently over the last few days. She also complains of malaise, cough productive of thick yellow to green sputum, and "episodes of narcolepsy." She denies losing consciousness.   BP 106/64 mmHg  Pulse 99  SpO2 97%  Review of Systems  Constitutional: Positive for fever and malaise/fatigue.  HENT: Positive for congestion and sore throat.        Reports nasal congestion and discharge; complains of sore throat; hoarseness noted.   Eyes: Negative.   Respiratory: Positive for cough and sputum production.        Intermittent cough productive of thick yellow/light green sputum.   Cardiovascular: Negative.   Gastrointestinal: Negative.   Genitourinary: Negative.   Musculoskeletal: Positive for myalgias.       Patient has fibromyalgia and today says "it hurts if I touch anything and if anything touches me."  Skin:       Bruises both arms from IV sticks while hospitalized; no signs/symptoms of infection.  Neurological: Positive for weakness.       Patient has diagnosis of narcolepsy and reports that she "takes a step and falls asleep". She says she is very sleepy over the last 2 days and feels her narcolepsy is "flared up."  Endo/Heme/Allergies: Negative.   Psychiatric/Behavioral: Negative.       Physical Exam  Constitutional: She is oriented to person, place, and time. She appears well-developed and well-nourished. She appears lethargic.  Cardiovascular: Regular rhythm.   Respiratory: Effort normal. No accessory muscle usage. No respiratory distress. She has decreased breath sounds in the right lower field and the left lower field. She has no wheezes. She has rhonchi in the right lower field and the left lower field. She exhibits tenderness.  Rhonchi bilateral lower lobes; reports "tenderness" in mid back with deep breath.  GI: Soft. Normal appearance.  Neurological: She is oriented to person, place, and time. She appears lethargic.  Skin: Skin is warm and dry.     Psychiatric: She has a normal mood and affect. Her speech is normal and behavior is normal. Judgment and thought content normal. Cognition and memory are normal.    Current Medications:   Current Outpatient Prescriptions  Medication Sig Dispense Refill  . acyclovir (ZOVIRAX) 400 MG tablet Take 400 mg by mouth 2 (two) times daily.      Marland Kitchen ALPRAZolam (XANAX) 0.25 MG tablet Take 0.25 mg by mouth 3 (three) times daily as needed. And every night.    Marland Kitchen amLODipine (NORVASC) 5 MG tablet Take 5 mg by mouth daily.    Marland Kitchen aspirin EC 81 MG tablet Take 81 mg by mouth at bedtime.    . bisacodyl (BISACODYL) 5 MG EC tablet Take 5 mg by mouth daily as needed. For stool softener    . buPROPion (WELLBUTRIN XL) 300 MG  24 hr tablet Take 300 mg by mouth at bedtime.     . cetirizine (ZYRTEC) 10 MG tablet Take 10 mg by mouth at bedtime.     . Cholecalciferol (VITAMIN D-3) 1000 UNITS CAPS Take 1,000 Units by mouth at bedtime.     . clidinium-chlordiazePOXIDE (LIBRAX) 5-2.5 MG per capsule Take 1 capsule by mouth 2 (two) times daily as needed (for chafing).    . Diclofenac Sodium 1.5 % SOLN Place 40 drops onto the skin 4 (four) times daily as needed (pain).     . DULoxetine (CYMBALTA) 60 MG capsule Take 60 mg by mouth daily.     Marland Kitchen EPINEPHrine  (EPIPEN 2-PAK) 0.3 mg/0.3 mL DEVI Inject 0.3 mg into the muscle once. To food allergies, delayed reaction    . fentaNYL (DURAGESIC - DOSED MCG/HR) 75 MCG/HR Place 75 mcg onto the skin every 3 (three) days.     . fluticasone (CUTIVATE) 0.05 % cream Apply 1 application topically 2 (two) times daily.     Marland Kitchen guaiFENesin-dextromethorphan (ROBITUSSIN DM) 100-10 MG/5ML syrup Take 5 mLs by mouth every 4 (four) hours as needed for cough. 118 mL 0  . HYDROcodone-homatropine (HYCODAN) 5-1.5 MG/5ML syrup Take 5 mLs by mouth every 4 (four) hours as needed. For cough    . hydrOXYzine (VISTARIL) 25 MG capsule Take 1 capsule (25 mg total) by mouth 3 (three) times daily as needed. (Patient not taking: Reported on 10/09/2014) 30 capsule 0  . ketoconazole (NIZORAL) 2 % cream Apply 1 application topically 2 (two) times daily.     Marland Kitchen levalbuterol (XOPENEX HFA) 45 MCG/ACT inhaler Inhale 2 puffs into the lungs 3 (three) times daily.    Marland Kitchen levofloxacin (LEVAQUIN) 750 MG tablet Take 1 tablet (750 mg total) by mouth daily. 4 tablet 0  . levothyroxine (SYNTHROID, LEVOTHROID) 75 MCG tablet Take 75 mcg by mouth every morning.     . Lidocaine, Anorectal, 5 % CREA Apply topically daily as needed.    . Magnesium Hydroxide (PHILLIPS MILK OF MAGNESIA PO) Take 1-2 capsules by mouth at bedtime.     . meloxicam (MOBIC) 15 MG tablet Take 15 mg by mouth at bedtime.     . methylphenidate (RITALIN) 20 MG tablet Take 50 mg by mouth every morning.     . montelukast (SINGULAIR) 10 MG tablet Take 10 mg by mouth at bedtime.    . ondansetron (ZOFRAN-ODT) 8 MG disintegrating tablet Take 8 mg by mouth every 6 (six) hours as needed for nausea or vomiting.     Marland Kitchen oxycodone (OXY-IR) 5 MG capsule Take 10 mg by mouth every 4 (four) hours as needed. For break through pain    . pentosan polysulfate (ELMIRON) 100 MG capsule Take 200 mg by mouth 2 (two) times daily.     . potassium chloride (KLOR-CON) 10 MEQ CR tablet Take 30 mEq by mouth 3 (three) times  daily. Patient takes up to 70-80 meq daily    . predniSONE (DELTASONE) 10 MG tablet Takes 6 tablets for 1 days, then 5 tablets for 1 days, then 4 tablets for 1 days, then 3 tablets for 1 days, then 2 tabs for 1 days, then 1 tab for 1 days, and then stop. 21 tablet 0  . Probiotic Product (ACIDOPHILUS/GOAT MILK) CAPS Take 1 tablet by mouth at bedtime. Ultimate Flora    . RABEprazole (ACIPHEX) 20 MG tablet Take 20 mg by mouth 2 (two) times daily. 40 mg in the morning and 20 mg at night.    Marland Kitchen  sodium chloride (OCEAN) 0.65 % nasal spray Place 1 spray into the nose as needed. For nasal decongestion    . talc powder Apply 1 application topically as needed.    . topiramate (TOPAMAX) 50 MG tablet Take 50 mg by mouth 3 (three) times daily.     Marland Kitchen triamcinolone (NASACORT) 55 MCG/ACT nasal inhaler Place 2 sprays into the nose as needed.    . triamterene-hydrochlorothiazide (MAXZIDE) 75-50 MG per tablet Take 1 tablet by mouth every morning.     . valsartan (DIOVAN) 320 MG tablet Take 320 mg by mouth daily.     No current facility-administered medications for this visit.    Functional Status:   In your present state of health, do you have any difficulty performing the following activities: 10/10/2014  Hearing? N  Vision? N  Difficulty concentrating or making decisions? Y  Walking or climbing stairs? Y  Dressing or bathing? N  Doing errands, shopping? Y  Preparing Food and eating ? Y  Using the Toilet? N  In the past six months, have you accidently leaked urine? N  Do you have problems with loss of bowel control? N  Managing your Medications? N  Managing your Finances? N  Housekeeping or managing your Housekeeping? Y     Assessment:    Acute/Chronic Health Condition (COPD with new/worsening symptoms) - Mrs. Stenzel has productive cough, elevated temperature compared to her baseline (97.1), tachycardia (99-103 compared to baseline 80's),  Rhonchi in bilateral lower fields, and worsening malaise. She  completed a course of oral Levaquin and steroids on Saturday, April 9. Mrs. Deboy has not been using Xopenex inhaler as prescribed (2 puffs BID) and is using it "when I think about it." She has also adjusted her methylphenidate dose because she said she didn't want to take the full dose while she was taking steroids.   Plan:   I notified Dr. Luan Pulling of Mrs. Romo's symptoms via phone message through [place] at his office.   Mrs. Tindale is to continue medications as prescribed until she hears from Dr. Luan Pulling. I encouraged her to especially use her Xopenex inhaler as prescribed. I encouraged her to eat balanced meals and drink water throughout the day to assist with hydration/maintenance.   I will follow up with Mrs. Kantner by phone tomorrow.    [place]        Patient Outreach from 10/21/2014 in [place] Problem Two  Respiratory Illness in setting of COPD   Care Plan for Problem Two  Active   Interventions for Problem Two Long Term Goal   notified provider of new/worsened symptoms,  acute home visit/assessment day of report of symptoms   THN Long Term Goal (31-90) days  patient will not be hospitalized with respiratory/pulmonary illness in the next 31 days   THN Long Term Goal Start Date  10/21/14   [facility] CM Short Term Goal #1 (0-30 days)  patient will take all medications as prescribed over the next 30 days as evidenced by patient/spouse report   THN CM Short Term Goal #1 Start Date  10/21/14   THN CM Short Term Goal #2 (0-30 days)  patient will see provider as scheduled in the next 7 days as evidenced by patient and provider report   [facility] CM Short Term Goal #2 Start Date  10/21/14      [facility] Care Management  [phone]

## 2014-10-22 ENCOUNTER — Encounter: Payer: Self-pay | Admitting: *Deleted

## 2014-10-22 NOTE — Patient Outreach (Signed)
[facility]) Care Management  10/22/2014  Kristina DiverJaneice B Horton 28-Sep-1945 [id number]   [facility] yesterday afternoon indicating that Dr. Juanetta GoslingHawkins is starting [name] on a course of oral doxycycline for treatment of her ongoing respiratory/pulmonary symptoms. I called the [place] home and spoke with [name]'s husband Dorene SorrowJerry who reports that he picked up the medication and [name] has started taking it. Dorene SorrowJerry also stated that [name] had an episode of "intense nausea" last evening for which she took Zofran and got relief.   I will follow [name]'s progress closely and will see her at home later this week for COPD assessment. [name] is to see Dr. Juanetta GoslingHawkins in the office on Monday.    Marja Kayslisa Gilboy MHA,BSN,RN,CCM [facility] Care Management  [phone]

## 2014-10-25 ENCOUNTER — Other Ambulatory Visit: Payer: Self-pay | Admitting: *Deleted

## 2014-10-25 NOTE — Patient Outreach (Signed)
[facility]) Care Management   10/25/2014  Kristina Horton [birth year] [id number]  Kristina Horton is an 69 y.o. female  Subjective:  "I just can't believe how long it has taken me to get over this. I'm still so tired."  Objective: Kristina Horton was self referred to [place] [facility] for follow up of recent hospitalization for COPD exacerbation. She was previously a patient of [place] [facility] but met her goals and was discharged. She has a history of essential hypertension, irritable bowel syndrome, interstitial cystitis, fibromyalgia, gastroparesis, COPD.   I saw Kristina Horton at home on 4/11 per her request and reported ongoing low grade fever, productive cough, and malaise to Dr. Sinda Du who prescribed a course of oral Doxycycline. I saw Kristina Horton today for follow up.   BP 132/72 mmHg  Pulse 99  Temp(Src) 98 F (36.7 C)  SpO2 98%  Review of Systems  Constitutional: Positive for malaise/fatigue.  HENT: Negative.   Eyes: Negative.   Respiratory: Positive for cough and sputum production.   Cardiovascular: Negative.   Gastrointestinal: Negative.   Genitourinary: Negative.   Musculoskeletal: Positive for myalgias.  Neurological: Negative.   Endo/Heme/Allergies: Bruises/bleeds easily.  Psychiatric/Behavioral: Negative.     Physical Exam  Constitutional: She is oriented to person, place, and time. Vital signs are normal. She appears well-developed and well-nourished. She is active.  Cardiovascular: Normal rate, regular rhythm and normal heart sounds.   Respiratory: Effort normal. She has decreased breath sounds in the right lower field and the left lower field. She has rhonchi in the left lower field.  GI: Soft. Bowel sounds are normal.  Neurological: She is alert and oriented to person, place, and time.  Skin: Skin is warm and dry. Bruising noted.     Psychiatric: She has a normal mood and affect. Her speech is normal and behavior is normal.  Judgment and thought content normal. Cognition and memory are normal.    Current Medications:   Current Outpatient Prescriptions  Medication Sig Dispense Refill  . acyclovir (ZOVIRAX) 400 MG tablet Take 400 mg by mouth 2 (two) times daily.      Marland Kitchen ALPRAZolam (XANAX) 0.25 MG tablet Take 0.25 mg by mouth 3 (three) times daily as needed. And every night.    Marland Kitchen amLODipine (NORVASC) 5 MG tablet Take 5 mg by mouth daily.    Marland Kitchen aspirin EC 81 MG tablet Take 81 mg by mouth at bedtime.    . bisacodyl (BISACODYL) 5 MG EC tablet Take 5 mg by mouth daily as needed. For stool softener    . buPROPion (WELLBUTRIN XL) 300 MG 24 hr tablet Take 300 mg by mouth at bedtime.     . cetirizine (ZYRTEC) 10 MG tablet Take 10 mg by mouth at bedtime.     . Cholecalciferol (VITAMIN D-3) 1000 UNITS CAPS Take 1,000 Units by mouth at bedtime.     . clidinium-chlordiazePOXIDE (LIBRAX) 5-2.5 MG per capsule Take 1 capsule by mouth 2 (two) times daily as needed (for chafing).    . Diclofenac Sodium 1.5 % SOLN Place 40 drops onto the skin 4 (four) times daily as needed (pain).     . DULoxetine (CYMBALTA) 60 MG capsule Take 60 mg by mouth daily.     Marland Kitchen EPINEPHrine (EPIPEN 2-PAK) 0.3 mg/0.3 mL DEVI Inject 0.3 mg into the muscle once. To food allergies, delayed reaction    . fentaNYL (DURAGESIC - DOSED MCG/HR) 75 MCG/HR Place 75 mcg onto the skin every  3 (three) days.     . fluticasone (CUTIVATE) 0.05 % cream Apply 1 application topically 2 (two) times daily.     Marland Kitchen guaiFENesin-dextromethorphan (ROBITUSSIN DM) 100-10 MG/5ML syrup Take 5 mLs by mouth every 4 (four) hours as needed for cough. 118 mL 0  . HYDROcodone-homatropine (HYCODAN) 5-1.5 MG/5ML syrup Take 5 mLs by mouth every 4 (four) hours as needed. For cough    . hydrOXYzine (VISTARIL) 25 MG capsule Take 1 capsule (25 mg total) by mouth 3 (three) times daily as needed. (Patient not taking: Reported on 10/09/2014) 30 capsule 0  . ketoconazole (NIZORAL) 2 % cream Apply 1 application  topically 2 (two) times daily.     Marland Kitchen levalbuterol (XOPENEX HFA) 45 MCG/ACT inhaler Inhale 2 puffs into the lungs 3 (three) times daily.    Marland Kitchen levofloxacin (LEVAQUIN) 750 MG tablet Take 1 tablet (750 mg total) by mouth daily. (Patient not taking: Reported on 10/21/2014) 4 tablet 0  . levothyroxine (SYNTHROID, LEVOTHROID) 75 MCG tablet Take 75 mcg by mouth every morning.     . Lidocaine, Anorectal, 5 % CREA Apply topically daily as needed.    . Magnesium Hydroxide (PHILLIPS MILK OF MAGNESIA PO) Take 1-2 capsules by mouth at bedtime.     . meloxicam (MOBIC) 15 MG tablet Take 15 mg by mouth at bedtime.     . methylphenidate (RITALIN) 20 MG tablet Take 50 mg by mouth every morning.     . montelukast (SINGULAIR) 10 MG tablet Take 10 mg by mouth at bedtime.    . ondansetron (ZOFRAN-ODT) 8 MG disintegrating tablet Take 8 mg by mouth every 6 (six) hours as needed for nausea or vomiting.     Marland Kitchen oxycodone (OXY-IR) 5 MG capsule Take 10 mg by mouth every 4 (four) hours as needed. For break through pain    . pentosan polysulfate (ELMIRON) 100 MG capsule Take 200 mg by mouth 2 (two) times daily.     . potassium chloride (KLOR-CON) 10 MEQ CR tablet Take 30 mEq by mouth 3 (three) times daily. Patient takes up to 70-80 meq daily    . predniSONE (DELTASONE) 10 MG tablet Takes 6 tablets for 1 days, then 5 tablets for 1 days, then 4 tablets for 1 days, then 3 tablets for 1 days, then 2 tabs for 1 days, then 1 tab for 1 days, and then stop. 21 tablet 0  . Probiotic Product (ACIDOPHILUS/GOAT MILK) CAPS Take 1 tablet by mouth at bedtime. Ultimate Flora    . RABEprazole (ACIPHEX) 20 MG tablet Take 20 mg by mouth 2 (two) times daily. 40 mg in the morning and 20 mg at night.    . sodium chloride (OCEAN) 0.65 % nasal spray Place 1 spray into the nose as needed. For nasal decongestion    . talc powder Apply 1 application topically as needed.    . topiramate (TOPAMAX) 50 MG tablet Take 50 mg by mouth 3 (three) times daily.     Marland Kitchen  triamcinolone (NASACORT) 55 MCG/ACT nasal inhaler Place 2 sprays into the nose as needed.    . triamterene-hydrochlorothiazide (MAXZIDE) 75-50 MG per tablet Take 1 tablet by mouth every morning.     . valsartan (DIOVAN) 320 MG tablet Take 320 mg by mouth daily.     No current facility-administered medications for this visit.    Functional Status:   In your present state of health, do you have any difficulty performing the following activities: 10/10/2014  Hearing? N  Vision? N  Difficulty concentrating or making decisions? Y  Walking or climbing stairs? Y  Dressing or bathing? N  Doing errands, shopping? Y  Preparing Food and eating ? Y  Using the Toilet? N  In the past six months, have you accidently leaked urine? N  Do you have problems with loss of bowel control? N  Managing your Medications? N  Managing your Finances? N  Housekeeping or managing your Housekeeping? Y    Fall/Depression Screening:    Depression screen [facility] 2/9 10/25/2014  Decreased Interest 0  Down, Depressed, Hopeless 0  PHQ - 2 Score 0    Assessment:    Acute/Chronic Health Condition (COPD) - Mrs. Briguglio is taking prescribed oral doxycycline x 3 days now and has seen improvement in overall feeling of wellness, return of temperature to normal for her, and "easier breathing". Mrs. Woodson has mild congestion in her LLL but is without wheezes. O2 sat is > 97% on RA. She has much better color today and is more active and talkative.   Plan:   Mrs. Grieshop will see Dr. Luan Pulling as scheduled on Monday for post hospital follow up.  Mrs. Borum will continue oral antibiotics and other prescribed medications.   I will follow up with Mrs. Marker by phone next week.   [place]        Patient Outreach from 10/21/2014 in [place] Problem Two  Respiratory Illness in setting of COPD   Care Plan for Problem Two  Active   Interventions for Problem Two Long Term Goal   notified  provider of new/worsened symptoms,  acute home visit/assessment day of report of symptoms   THN Long Term Goal (31-90) days  patient will not be hospitalized with respiratory/pulmonary illness in the next 31 days   THN Long Term Goal Start Date  10/21/14   [facility] CM Short Term Goal #1 (0-30 days)  patient will take all medications as prescribed over the next 30 days as evidenced by patient/spouse report   [facility]N CM Short Term Goal #1 Start Date  10/21/14   [facility] CM Short Term Goal #2 (0-30 days)  patient will see provider as scheduled in the next 7 days as evidenced by patient and provider report   [facility] CM Short Term Goal #2 Start Date  10/21/14      [place] [facility] Care Management  [phone]

## 2014-11-01 ENCOUNTER — Other Ambulatory Visit: Payer: Self-pay | Admitting: *Deleted

## 2014-11-04 ENCOUNTER — Other Ambulatory Visit: Payer: Self-pay | Admitting: *Deleted

## 2014-11-04 NOTE — Patient Outreach (Signed)
Call received from Mrs. Safley indicating that she is still concerned about her level of fatigue. She also self reports heart rate variation with "highs of 115-120 but coming down to 95" and says this is concerning to her. Mrs. Interrante is inquiring about whether she should be tested for mono. Dr[facility]Haw[facility] but Mrs. Freestone says she didn't know the lab closed at 5pm and didn't get there. I provided updates as outlined above to Dr. Juanetta GoslingHawkins.   Marja Kayslisa Rondalyn Belford MHA,BSN,RN,CCM [facility] Care Management  [phone]

## 2014-11-05 ENCOUNTER — Other Ambulatory Visit: Payer: Self-pay | Admitting: *Deleted

## 2014-11-05 NOTE — Patient Outreach (Signed)
Call received from Mrs. Neira who reports that she continues to have profound fatigue and cough productive of "greenish" sputum. Mrs. Commins denies fever, chest pain, or increased shortness of breath or dyspnea on exertion. She notes that she is still not taking her prescribed dose of methylphenidate. I notified Dr. Juanetta GoslingHawkins of Mrs. Tinda's reported symptoms and offered Mrs. Yahr assistance in making an appointment to be seen in the office by Dr. Juanetta GoslingHawkins. Mrs. Scheeler indicated that she would hold off on making an appointment with Dr.Hawkins but would keep her appointment with Dr.Sood/Tammy Parrett FNP [facility] on Thursday at noon. I advised Mrs. Flatt to notify Dr. Juanetta GoslingHawkins of worsening or new symptoms and to call 911 if she experiences acute shortness of breath, chest pain, or other urgent symptom.   Plan: Mrs. Staff will see Dr. Despina PoleSood/Tammy Parrett FNP on Thursday 11/07/14. Mrs. Thain will call for new or worsening symptoms.    Marja Kayslisa Gilboy MHA,BSN,RN,CCM [facility] Care Management  [phone]

## 2014-11-07 ENCOUNTER — Other Ambulatory Visit: Payer: Self-pay | Admitting: Adult Health

## 2014-11-07 ENCOUNTER — Ambulatory Visit (INDEPENDENT_AMBULATORY_CARE_PROVIDER_SITE_OTHER): Payer: Medicare PPO | Admitting: Adult Health

## 2014-11-07 ENCOUNTER — Encounter: Payer: Self-pay | Admitting: Adult Health

## 2014-11-07 ENCOUNTER — Ambulatory Visit (INDEPENDENT_AMBULATORY_CARE_PROVIDER_SITE_OTHER)
Admission: RE | Admit: 2014-11-07 | Discharge: 2014-11-07 | Disposition: A | Discharge status: Home / Self Care | Payer: Medicare PPO | Source: Ambulatory Visit | Attending: Adult Health | Admitting: Adult Health

## 2014-11-07 VITALS — BP 154/82 | HR 110 | Temp 98.4°F | Ht <= 58 in | Wt 172.0 lb

## 2014-11-07 DIAGNOSIS — J441 Chronic obstructive pulmonary disease with (acute) exacerbation: Secondary | ICD-10-CM

## 2014-11-07 DIAGNOSIS — R05 Cough: Secondary | ICD-10-CM | POA: Diagnosis not present

## 2014-11-07 DIAGNOSIS — R053 Chronic cough: Secondary | ICD-10-CM

## 2014-11-07 DIAGNOSIS — G4733 Obstructive sleep apnea (adult) (pediatric): Secondary | ICD-10-CM | POA: Diagnosis not present

## 2014-11-07 MED ORDER — AMOXICILLIN-POT CLAVULANATE 875-125 MG PO TABS: 1.0000 | ORAL_TABLET | Freq: Two times a day (BID) | ORAL | Status: DC

## 2014-11-07 NOTE — Patient Instructions (Signed)
Mucinex  Twice daily  As needed  Cough/congestion  Delsym 2 tsp Twice daily  As needed  Cough .  GERD diet .  Sputum culture today .  follow up Dr. Delton CoombesByrum  In 2-3 weeks with PFT  Please contact office for sooner follow up if symptoms do not improve or worsen or seek emergency care

## 2014-11-07 NOTE — Progress Notes (Signed)
Quick Note:  Called spoke with patient, advised of cxr results / recs as stated by TP. Pt verbalized her understanding and denied any questions. Orders only encounter [facility]'s [facility]. Appt scheduled with RB for 5.11.16 @ 1.30pm, pt aware to arrive early for STAT cxr. Pt does request additional rx for brand name Diflucan #3 despite the recommendations for yogurt daily; advised will need to discuss with TP and will call her tomorrow. Tammy please advise, thank you. ______

## 2014-11-07 NOTE — Progress Notes (Signed)
Result Notes     Notes Recorded by Lowell BoutonJessica E Stefen Juba, CMA on 11/07/2014 at 5:24 PM Called spoke with patient, advised of cxr results / recs as stated by TP. Pt verbalized her understanding and denied any questions. Orders only encounter created [facility] pt's request. Appt scheduled with RB for 5.11.16 @ 1.30pm, pt aware to arrive early for STAT cxr. Pt does request additional rx for brand name Diflucan #3 despite the recommendations for yogurt daily; advised will need to discuss with TP and will call her tomorrow. Tammy please advise, thank you. ------  Notes Recorded by Julio Sicksammy S Parrett, NP on 11/07/2014 at 4:44 PM CXR shows PNA in LLL  Multiple drug allergies Recent Levaquin 750mg   Would begin Augmentin 875mg  Twice daily X 10 days  Take with food , eat yogurt daily  Take probiotic daily  Please contact office for sooner follow up if symptoms do not improve or worsen or seek emergency care  follow up with Dr. Delton CoombesByrum In 2 weeks with cxr

## 2014-11-07 NOTE — Progress Notes (Signed)
Subjective:    Patient ID: Kristina Horton, female    DOB: June 08, 1946, 69 y.o.   MRN: [id number]  HPI  69 y.o. woman, former smoker (normal PFT 06/2008), IBS, fibromyalgia (Deveshwar), allergic rhinitis, OSA (doesn't use her PPV, PSG 05/08/08), HTN.  She is referred for chronic cough. She tells me that she started coughing in January 2012, bothers her when she is warm, when she exerts or goes outside. She believes that there is an allergy component - can't take allergy shots. She is on zyrtec, nasocort. Has been treated with abx (doxy and avelox), no steroids. She has had CXR, no CT scan chest.  She has GERD, seems to be managed on Aciphex bid.   10/11/2011  [facility] office patient not seen since 10/12 Chronic cough, worse if windy and allergy flare.  Had allergy tests in the past Did have fever 1 week ago, took doxy and fever went away.  Still brings up mucus.  Mucus is yellow.  Pt is still dyspneic with exertion  ROV 11/12/11 -- 69 yo woman, former smoker (normal PFT 06/2008), IBS, fibromyalgia (Deveshwar), allergic rhinitis, OSA (doesn't use her PPV, PSG 05/08/08), HTN.  Seen by Dr Delford Field for a flare of her chronic cough. Was rx with augmentin + steroids.  She is taking zyrtec, misplaced her nasocort. She stopped NSW's. Remains Aciphex.   ROV 05/12/12 -- allergic rhinitis, OSA, HTN, allergies, GERD, cough. She is on nasonex, zyrtec. Feels some mucous back of her throat. Not doing NSW right now. Having R ear fullness and some pain like it needs to "pop". Still on aciphex bid.   11/07/2014 Acute OV : allergic rhinitis, OSA, HTN, allergies, GERD, cough. Pt presents for an acute office visit.  Says she has been having cough for last 2 months .  Complains of on/off c/o prod cough with green mucus, wheezing, tightness, head congestion w/ light green mucus, PND, low grade temp x2 months.   Denies chest pain, orthopnea, edema , n/v/d, dyspnea, hemoptysis, calf pain, intentional wt loss.  Has OSA but does not  wear CPAP.  Was admitted 3/30-4/3 for AECOPD (not on maintence therapy -?prev PFT) .  tx with abx, steroids and nebs.  She has finished abx and steroids but does not feel that her cough is much better.  CXR 3/30 with no acute process.  She is followed by [facility] home nursing.  Feels very tired and low energy.  She has chronic back pain on daily narcotics.    ROS  Constitutional:   No  weight loss, night sweats,  + Fevers, chills, fatigue, or  lassitude.  HEENT:   No headaches,  Difficulty swallowing,  Tooth/dental problems, or  Sore throat,                No sneezing, itching, ear ache,  +nasal congestion, post nasal drip,   CV:  No chest pain,  Orthopnea, PND, swelling in lower extremities, anasarca, dizziness, palpitations, syncope.   GI  No heartburn, indigestion, abdominal pain, nausea, vomiting, diarrhea, change in bowel habits, loss of appetite, bloody stools.   Resp:  No chest wall deformity  Skin: no rash or lesions.  GU: no dysuria, change in color of urine, no urgency or frequency.  No flank pain, no hematuria   MS:  No joint pain or swelling.  No decreased range of motion.  No back pain.  Psych:  No change in mood or affect. No depression or anxiety.  No memory loss.  Objective:   Physical Exam BP 154/82 mmHg  Pulse 110  Temp(Src) 98.4 F (36.9 C) (Oral)  Ht 4\' 10"  (1.473 m)  Wt 172 lb (78.019 kg)  BMI 35.96 kg/m2  SpO2 95%  Gen: Chronically ill,  in no distress, depressed affect  ENT: No lesions,  mouth clear,  oropharynx clear,  no erythema, no purulence,, clear nasal discharge   Neck: No JVD, no TMG, no carotid bruits  Lungs: No use of accessory muscles, no dullness to percussion, clear without rales or rhonchi,   Cardiovascular: RRR, heart sounds normal, no murmur or gallops, no peripheral edema  Musculoskeletal: No deformities, no cyanosis or clubbing  Neuro: alert, non focal  Skin: Warm, no lesions or rashes     Assessment &  Plan:  No problem-specific assessment & plan notes found for this encounter.

## 2014-11-07 NOTE — Assessment & Plan Note (Signed)
Underlying OSA w/ CPAP noncompliance  Suspect is contributing to fatigue.  Discuss on return  May need ONO to check for nocturnal hypoxia

## 2014-11-07 NOTE — Progress Notes (Signed)
Quick Note:  Will route to TP ______

## 2014-11-07 NOTE — Assessment & Plan Note (Signed)
Slow to resolve COPD exacerbation  She is former smoker , unclear if she has underlying COPD -needs PFT  Will check cxr today for acute process Check sputum cx  If symptoms persist consider CT sinus .  Hold on additional abx for now  Control for triggers.   Plan  Mucinex  Twice daily  As needed  Cough/congestion  Delsym 2 tsp Twice daily  As needed  Cough .  GERD diet .  Sputum culture today .  follow up Dr. Delton CoombesByrum  In 2-3 weeks with PFT  Please contact office for sooner follow up if symptoms do not improve or worsen or seek emergency care

## 2014-11-08 ENCOUNTER — Other Ambulatory Visit: Payer: Medicare PPO

## 2014-11-08 ENCOUNTER — Telehealth: Payer: Self-pay | Admitting: Emergency Medicine

## 2014-11-08 DIAGNOSIS — R05 Cough: Secondary | ICD-10-CM

## 2014-11-08 DIAGNOSIS — R053 Chronic cough: Secondary | ICD-10-CM

## 2014-11-08 DIAGNOSIS — J441 Chronic obstructive pulmonary disease with (acute) exacerbation: Secondary | ICD-10-CM

## 2014-11-08 NOTE — Progress Notes (Signed)
Quick Note:  See 4.29.16 phone note ______

## 2014-11-08 NOTE — Telephone Encounter (Signed)
Pt aware that she needs to get a sputum culture into our lab whenever she is able - per rec's from last OV Pt aware per TP that Diflucan is not going to be prescribed -- aware to take Probiotic daily and yogurt ( gave rec's of Align) Pt states that she has tried this before and she still got a yeast infection.  I advised pt to try this as she was given a different abx than she has had before, may not see any yeast. If so, contact our office for further rec's Pt is asking about a daily maintenance inhaler, if she needs to be on something daily or just use her Xopenex for frequently. I advised her that we never recommend using the rescue inhaler more than what its purpose is - "rescue, emergency" Pt advised that if she is having to use her Xop hfa frequently to let us know, pt reports not using more than 1-2 times daily if even that.  Pt is requesting to speak with Tammy Parrett. I advised that she is seeing patient's all afternoon but that I will pass the message along.  Pt states that she just had a little something she wanted to discuss with Tammy Parrett personally and wants a call back today.   Will send to TP as FYI.

## 2014-11-08 NOTE — Telephone Encounter (Signed)
Pt w/ several complaints regarding her care , listened with support proviided.  Focused on her care , dx and ongoing workup .  Advised on probiotic/yogurt, hold on diflucan for now as no yeast infection and she has 20+ meds along w/ new abx so high risk for drug to drug interaction.  She agrees with this  Keep follow up with cxr  Please contact office for sooner follow up if symptoms do not improve or worsen or seek emergency care

## 2014-11-10 ENCOUNTER — Telehealth: Payer: Self-pay | Admitting: Critical Care Medicine

## 2014-11-10 MED ORDER — MOXIFLOXACIN HCL 400 MG PO TABS: 400.0000 mg | ORAL_TABLET | Freq: Every day | ORAL | Status: DC

## 2014-11-10 NOTE — Telephone Encounter (Signed)
Pt called and having nausea and emesis with augmentin Rx on 4/28.  Called in avelox 400mg  / d x 7days and STOP Augmentin Pt verbalized understanding  Pt with [facility] PNA on CXR  Told pt we will call her with appt this week

## 2014-11-11 ENCOUNTER — Ambulatory Visit: Payer: Medicare PPO | Admitting: Critical Care Medicine

## 2014-11-11 LAB — RESPIRATORY CULTURE OR RESPIRATORY AND SPUTUM CULTURE: Organism ID, Bacteria: NORMAL

## 2014-11-11 MED ORDER — DIFLUCAN 150 MG PO TABS
150.0000 mg | ORAL_TABLET | Freq: Every day | ORAL | Status: DC
Start: 2014-11-11 — End: 2014-11-15

## 2014-11-11 MED ORDER — DIFLUCAN 150 MG PO TABS: 150.0000 mg | ORAL_TABLET | Freq: Every day | ORAL | Status: DC

## 2014-11-11 NOTE — Telephone Encounter (Signed)
Ok to try diflucan 150mg  daily for 5 days

## 2014-11-11 NOTE — Telephone Encounter (Signed)
Rx called in and is aware to contact office if symptoms worsen prior to pending appt.  SHe verbalized understanding and voiced no further questions or concerns at this time.

## 2014-11-11 NOTE — Telephone Encounter (Signed)
Pt started avelox last night. Stomach issues "much better."  Still coughing with green mucus - no better or worse. Appt from today rescheduled to see Dr Delton CoombesByrum on May 5 at 1:30 pm. She verbalized understanding of rescheduled appt.  Pt c/o red, lumpy, itching, rash under abd skin folds and "thrush looking" area in mouth with tingling sensation.  States she is eating yogurt qd and taking probiotic.  Reports this happens every time she is on abx and is requesting name brand diflucan from symptoms.  Dr. Delford FieldWright, please advise. Thank you.  [facility]

## 2014-11-11 NOTE — Addendum Note (Signed)
Addended by: Gweneth DimitriJONES, Lochlin Eppinger D on: 11/11/2014 11:16 AM   Modules accepted: Orders

## 2014-11-11 NOTE — Telephone Encounter (Signed)
Pt has been scheduled today with PW. Nothing further was needed.

## 2014-11-14 ENCOUNTER — Telehealth: Payer: Self-pay | Admitting: Emergency Medicine

## 2014-11-14 ENCOUNTER — Ambulatory Visit: Payer: Medicare PPO | Admitting: Emergency Medicine

## 2014-11-14 NOTE — Telephone Encounter (Signed)
Discussed with Dot LanesKrista Pt is very adamant about being seen tomorrow afternoon - however, this will be difficult to get approval from the provider whom will assume her care in such short notice.  MW does have an opening tomorrow afternoon at 3.30pm.  If MW is okay with this, she can be seen tomorrow IN THE INTERIM while we get her care switched to another provider.  He would also be able to address the question/concern about her abx.  Dot LanesKrista will speak with MW about the above.  Will hold in triage until issue is resolved.

## 2014-11-14 NOTE — Telephone Encounter (Signed)
Pt needs an appt asap. pt wants to switch drs. pt perfers PW, i informed pt PW does not take new pts at this time. She will be happy to see anyone else. Pt perfers to be seen tomorrow but her appt has to be after lunch. pt wishes to only talk to [place] in regards to appts for now on.  Pt also has questions in regards to her ABX. She has been on several abx's for her hip and tooth and so on. Pt wants to make sure that this will not interfere with pts care.   Please call pt on [phone]. Also Dot Laneskrista will call pt to make appt when directed to do so. Pt would like a call by the end of the day.

## 2014-11-15 ENCOUNTER — Ambulatory Visit (INDEPENDENT_AMBULATORY_CARE_PROVIDER_SITE_OTHER): Payer: Medicare PPO | Admitting: Internal Medicine

## 2014-11-15 ENCOUNTER — Encounter: Payer: Self-pay | Admitting: Internal Medicine

## 2014-11-15 ENCOUNTER — Ambulatory Visit (INDEPENDENT_AMBULATORY_CARE_PROVIDER_SITE_OTHER)
Admission: RE | Admit: 2014-11-15 | Discharge: 2014-11-15 | Disposition: A | Discharge status: Home / Self Care | Payer: Medicare PPO | Source: Ambulatory Visit | Attending: Internal Medicine | Admitting: Internal Medicine

## 2014-11-15 VITALS — BP 122/60 | HR 98 | Ht <= 58 in | Wt 177.0 lb

## 2014-11-15 DIAGNOSIS — J189 Pneumonia, unspecified organism: Secondary | ICD-10-CM

## 2014-11-15 DIAGNOSIS — R053 Chronic cough: Secondary | ICD-10-CM

## 2014-11-15 DIAGNOSIS — R05 Cough: Secondary | ICD-10-CM | POA: Diagnosis not present

## 2014-11-15 MED ORDER — FLUTTER DEVI: Status: AC

## 2014-11-15 NOTE — Progress Notes (Signed)
Quick Note:  Pt given results at 5.6.16 ov w/ MW ______

## 2014-11-15 NOTE — Progress Notes (Signed)
Subjective:    Patient ID: Kristina Horton, female    DOB: 09/04/1945, 69 y.o.   MRN: [id number]    Brief patient profile:  69 yo obese wf   Quit smoking 1980 (normal PFT 06/2008), GERD/gastroparesis,  IBS, fibromyalgia (Deveshwar), allergic rhinitis, OSA (doesn't use her PPV, PSG 05/08/08), HTN.  She is referred for chronic cough xJanuary 2012, bothers her when she is warm, when she exerts or goes outside. She believes that there is an allergy component - can't take allergy shots.      ROV 05/12/12 -- Dr Delton CoombesByrum:  allergic rhinitis, OSA, HTN, allergies, GERD, cough. She is on nasonex, zyrtec. Feels some mucous back of her throat. Not doing NSW right now. Having R ear fullness and some pain like it needs to "pop". Still on aciphex bid.  rec Continue your aciphex twice a day Continue your nasonex and zyrtec Consider restarting your nasal saline washes daily Try using decongestants that contain either chlorpheniramine or brompheniramine as directed (over-the-counter)   11/07/2014 NP Acute OV : allergic rhinitis, OSA, HTN, allergies, GERD, cough. Pt presents for an acute office visit.  Says she has been having cough for last 2 months .  Complains of on/off c/o prod cough with green mucus, wheezing, tightness, head congestion w/ light green mucus, PND, low grade temp x2 months.   Denies chest pain, orthopnea, edema , n/v/d, dyspnea, hemoptysis, calf pain, intentional wt loss.  Has OSA but does not wear CPAP.  Was admitted 3/30-4/3 for AECOPD (not on maintence therapy -?prev PFT) .  tx with abx, steroids and nebs.  She has finished abx and steroids but does not feel that her cough is much better.  CXR 3/30 with no acute process.  [facility].  Feels very tired and low energy.  She has chronic back pain on daily narcotics.  rec Mucinex  Twice daily  As needed  Cough/congestion  Delsym 2 tsp Twice daily  As needed  Cough .  GERD diet .  Sputum culture today > nl flora  .      11/15/2014  Acute extended  ov/Tae Robak re: cough x 2012 waxes and wanes/ never resolves on 3 pages of meds   Chief Complaint  Patient presents with  . Follow-up    Pt states that her cough had been better after visit on 11/07/14- but then worsened again. She is still having some chest tightness and wheezing also.   cough started 2012 never completely better since then generally worse day than night minimally productive green consistently and worse than usual since Jan 2016 and presently on AVelox ?  #7 /10 planned with increased need for xoponex but comfortable at rest   No obvious day to day or daytime variabilty or assoc  cp or  overt  hb symptoms on aciphex bid but not ac. No unusual exp hx or h/o childhood pna/ asthma or knowledge of premature birth.  Sleeping ok without nocturnal  or early am exacerbation  of respiratory  c/o's or need for noct saba. Also denies any obvious fluctuation of symptoms with weather or environmental changes or other aggravating or alleviating factors except as outlined above   Current Medications, Allergies, Complete Past Medical History, Past Surgical History, Family History, and Social History were reviewed in [facility] electronic medical record.  ROS  The following are not active complaints unless bolded sore throat, dysphagia, dental problems, itching, sneezing,  nasal congestion or excess/ purulent secretions, ear ache,  fever, chills, sweats, unintended wt loss, pleuritic or exertional cp, hemoptysis,  orthopnea pnd or leg swelling, presyncope, palpitations, heartburn, abdominal pain, anorexia, nausea, vomiting, diarrhea  or change in bowel or urinary habits, change in stools or urine, dysuria,hematuria,  rash, arthralgias, visual complaints, headache, numbness weakness or ataxia or problems with walking or coordination,  change in mood/affect or memory.                       Objective:   Physical Exam   Wt Readings from Last 3  Encounters:  11/15/14 177 lb (80.287 kg)  11/07/14 172 lb (78.019 kg)  10/13/14 179 lb 14.3 oz (81.6 kg)    Vital signs reviewed   amb wf  failed to answer a single question asked in a straightforward manner, tending to go off on tangents or answer questions with ambiguous medical terms or diagnoses    Gen: Chronically ill,  in no distress, depressed affect- harsh barking upper airway cough/pseudowheeze   ENT: No lesions,  mouth clear,  oropharynx clear,  no erythema, no purulence,, clear nasal discharge   Neck: No JVD, no TMG, no carotid bruits  Lungs: No use of accessory muscles, no dullness to percussion, completely clear bilaterally with no cough on insp or exp  Cardiovascular: RRR, heart sounds normal, no murmur or gallops, no peripheral edema  Musculoskeletal: No deformities, no cyanosis or clubbing  Neuro: alert, anxious non focal  Skin: Warm, no lesions or rashes   I personally reviewed images and agree with radiology impression as follows:  CXR:  11/15/14 1. Persistent density in the posterior lung base on the lateral view, described as a left lower lobe infiltrate on the prior exam. This may reflect persistent pneumonia or atelectasis or a combination. 2. No new abnormalities. No evidence of pulmonary edema. Stable mild cardiomegaly.       Assessment & Plan:

## 2014-11-15 NOTE — Patient Instructions (Signed)
Mucinex dm 1200 mg every 12 hours and use flutter as much as you can and if needed suppress with the hycodan or the oxyir until you are no longer coughing   Aciphex should be taken 30 before your first meal and last meal  GERD (REFLUX)  is an extremely common cause of respiratory symptoms just like yours , many times with no obvious heartburn at all.    It can be treated with medication, but also with lifestyle changes including avoidance of late meals, excessive alcohol, smoking cessation, and avoid fatty foods, chocolate, peppermint, colas, red wine, and acidic juices such as orange juice.  NO MINT OR MENTHOL PRODUCTS SO NO COUGH DROPS  USE SUGARLESS CANDY INSTEAD (Jolley ranchers or Stover's or Life Savers) or even ice chips will also do - the key is to swallow to prevent all throat clearing. NO OIL BASED VITAMINS - use powdered substitutes.   Please see patient coordinator before you leave today  to schedule sinus CT  Please remember to go to the xray  department downstairs for your tests - we will call you with the results when they are available  If you are not improving and you want my help:  See Tammy NP   with all your medications, pill organizers and spreadsheets and  even over the counter meds, separated in two separate bags, the ones you take no matter what vs the ones you stop once you feel better and take only as needed when you feel you need them.   Tammy  will generate for you a new user friendly medication calendar that will put us all on the same page re: your medication use.     Without this process, it simply isn't possible to assure that we are providing  your outpatient care  with  the attention to detail we feel you deserve.   If we cannot assure that you're getting that kind of care,  then we cannot manage your problem effectively from this clinic.  Once you have seen Tammy and we are sure that we're all on the same page with your medication use she will arrange follow up  with me.

## 2014-11-16 ENCOUNTER — Encounter: Payer: Self-pay | Admitting: Internal Medicine

## 2014-11-16 DIAGNOSIS — J189 Pneumonia, unspecified organism: Secondary | ICD-10-CM | POA: Insufficient documentation

## 2014-11-16 NOTE — Assessment & Plan Note (Addendum)
The most common causes of chronic cough in immunocompetent adults include the following: upper airway cough syndrome (UACS), previously referred to as postnasal drip syndrome (PNDS), which is caused by variety of rhinosinus conditions; (2) asthma; (3) GERD; (4) chronic bronchitis from cigarette smoking or other inhaled environmental irritants; (5) nonasthmatic eosinophilic bronchitis; and (6) bronchiectasis.   These conditions, singly or in combination, have accounted for up to 94% of the causes of chronic cough in prospective studies.   Other conditions have constituted no >6% of the causes in prospective studies These have included bronchogenic carcinoma, chronic interstitial pneumonia, sarcoidosis, left ventricular failure, ACEI-induced cough, and aspiration from a condition associated with pharyngeal dysfunction.    Chronic cough is often simultaneously caused by more than one condition. A single cause has been found from 38 to 82% of the time, multiple causes from 18 to 62%. Multiply caused cough has been the result of three diseases up to 42% of the time.       Based on hx and exam, this is most likely:  Classic Upper airway cough syndrome, so named because it's frequently impossible to sort out how much is  CR/sinusitis with freq throat clearing (which can be related to primary GERD)   vs  causing  secondary (" extra esophageal")  GERD from wide swings in gastric pressure that occur with throat clearing, often  promoting self use of mint and menthol lozenges that reduce the lower esophageal sphincter tone and exacerbate the problem further in a cyclical fashion.   These are the same pts (now being labeled as having "irritable larynx syndrome" by some cough centers) who not infrequently have a history of having failed to tolerate ace inhibitors,  dry powder inhalers or biphosphonates or report having atypical reflux symptoms that don't respond to standard doses of PPI , and are easily confused as  having aecopd or asthma flares by even experienced allergists/ pulmonologists.   The first step is to maximize acid suppression and eliminate cyclical coughing then regroup if the cough persists with full medication reconciliation before go any further with the cough w/u.   I had an extended discussion with the patient and husband  reviewing all relevant studies completed to date and  lasting 35 minutes of a 45 minute visit on the following ongoing concerns:   The standardized cough guidelines published in Chest by [employer] in 2006 are still the best available and consist of a multiple step process (up to 12!) , not a single office visit,  and are intended  to address this problem logically,  with an alogrithm dependent on response to empiric treatment at  each progressive step  to determine a specific diagnosis with  minimal addtional testing needed. Therefore if adherence is an issue or can't be accurately verified,  it's very unlikely the standard evaluation and treatment will be successful here.    Furthermore, response to therapy (other than acute cough suppression, which should only be used short term with avoidance of narcotic containing cough syrups if possible), can be a gradual process for which the patient may perceive immediate benefit.  Unlike going to an eye doctor where the best perscription is almost always the first one and is immediately effective, this is almost never the case in the management of chronic cough syndromes. Therefore the patient needs to commit up front to consistently adhere to recommendations  for up to 6 weeks of therapy directed at the likely underlying problem(s) before the response can be reasonably  evaluated.   Unlike when you get a prescription for eyeglasses, it's not possible to always walk out of this or any medical office with a perfect prescription that is immediately effective  based on any test that we offer here.    On the contrary, it may take  several weeks for the full impact of changes recommened today - hopefully you will respond well.  If not, then we'll adjust your medication on your next visit accordingly, knowing more then than we can possibly know now.      Finally, need a trust but verify approach here if she expects us to address this chronic cough effectively.  To keep things simple, I have asked the patient to first separate medicines that are perceived as maintenance, that is to be taken daily "no matter what", from those medicines that are taken on only on an as-needed basis and I have given the patient examples of both, and then return to see our NP to generate a  detailed  medication calendar which should be followed until the next physician sees the patient and updates it.

## 2014-11-16 NOTE — Assessment & Plan Note (Signed)
To complete a total of 10 days avelox then needs a f/u cxr at 4 weeks/ no sooner due to cxr lag issues and tendency to over treat "organizing pna" on cxr in this setting which is an inflammatory condition.

## 2014-11-18 ENCOUNTER — Telehealth: Payer: Self-pay | Admitting: Internal Medicine

## 2014-11-18 MED ORDER — MOXIFLOXACIN HCL 400 MG PO TABS
400.0000 mg | ORAL_TABLET | Freq: Every day | ORAL | Status: AC
Start: 2014-11-18 — End: 2014-12-02

## 2014-11-18 NOTE — Telephone Encounter (Signed)
Spoke with pt's husband and notified of cxr results.  F/U ov scheduled with cxr.  5 more days of Aveolx sent in to complete a 10 day course .

## 2014-11-18 NOTE — Telephone Encounter (Signed)
[phone] returning call

## 2014-11-18 NOTE — Telephone Encounter (Signed)
4 weeks is right unless she's feeling worse in interim

## 2014-11-18 NOTE — Telephone Encounter (Signed)
Everything has been taken care of with this message. Questions regarding the abx's have been addressed. Nothing further needed.  CAP (community acquired pneumonia) - Kristina CowdenMichael B Wert, MD at 11/16/2014 6:52 AM     Status: Written Related Problem: CAP (community acquired pneumonia)   Expand All Collapse All   To complete a total of 10 days avelox then needs a f/u cxr at 4 weeks/ no sooner due to cxr lag issues and tendency to over treat "organizing pna" on cxr in this setting which is an inflammatory condition.

## 2014-11-18 NOTE — Progress Notes (Signed)
Quick Note:  Called home number and someone answered but did not speak  Called mobile and LMTCB ______

## 2014-11-18 NOTE — Telephone Encounter (Signed)
Dr Sherene SiresWert please advise if you would like repeat cxr at 2 or 4 weeks.  Ov note from 11/15/14 says no sooner than 4 weeks but cxr report says 2 weeks.  Please advise

## 2014-11-20 ENCOUNTER — Other Ambulatory Visit: Payer: Medicare PPO

## 2014-11-21 ENCOUNTER — Telehealth: Payer: Self-pay | Admitting: Emergency Medicine

## 2014-11-21 NOTE — Telephone Encounter (Signed)
Called and spoke to pt. Pt requesting to change providers from RB to B[facility]  RB please advise if you are ok with this. Thanks.

## 2014-11-22 ENCOUNTER — Ambulatory Visit (INDEPENDENT_AMBULATORY_CARE_PROVIDER_SITE_OTHER)
Admission: RE | Admit: 2014-11-22 | Discharge: 2014-11-22 | Disposition: A | Discharge status: Home / Self Care | Payer: Medicare PPO | Source: Ambulatory Visit | Attending: Internal Medicine | Admitting: Internal Medicine

## 2014-11-22 DIAGNOSIS — R053 Chronic cough: Secondary | ICD-10-CM

## 2014-11-22 DIAGNOSIS — R05 Cough: Secondary | ICD-10-CM | POA: Diagnosis not present

## 2014-11-22 NOTE — Telephone Encounter (Signed)
I agree that she should not stay with me. OK with me for her to transfer to see someone else, but ONLY if OK with the receiving MD. It may be best if she were to change practices altogether if she is unhappy.

## 2014-11-25 NOTE — Progress Notes (Signed)
Quick Note:  Spoke with pt and notified of results per Dr. Wert. Pt verbalized understanding and denied any questions.  ______ 

## 2014-11-25 NOTE — Telephone Encounter (Signed)
[facility] - are okay with taking over this patient's care?

## 2014-11-28 ENCOUNTER — Other Ambulatory Visit: Payer: Self-pay | Admitting: *Deleted

## 2014-11-28 NOTE — Telephone Encounter (Signed)
LMTCB with the pt's spouse  

## 2014-11-28 NOTE — Telephone Encounter (Signed)
OK 

## 2014-11-29 NOTE — Telephone Encounter (Signed)
Pt called pt does not want to rschd PFT

## 2014-11-29 NOTE — Telephone Encounter (Signed)
Called and spoke with pt, did not reschedule appt. Pt states that she is going to keep her already scheduled appt 12/04/14. Nothing further needed.

## 2014-11-29 NOTE — Telephone Encounter (Signed)
Spoke with pt, scheduled for Consult appt with BQ 12/19/14 at 3pm ([place])  Pt is needing to reschedule her PFT on 12/04/14 as she has a scheduling conflict.  Pt given number to contact CardioPulm dept [phone]) and reschedule this. Pt aware to contact our office and let us know her new appt date/time.  Will await call back

## 2014-12-04 ENCOUNTER — Encounter (HOSPITAL_COMMUNITY): Payer: Medicare PPO

## 2014-12-04 ENCOUNTER — Ambulatory Visit: Payer: Medicare PPO | Admitting: Emergency Medicine

## 2014-12-16 ENCOUNTER — Ambulatory Visit: Payer: Medicare PPO | Admitting: Internal Medicine

## 2014-12-19 ENCOUNTER — Institutional Professional Consult (permissible substitution): Payer: Medicare PPO | Admitting: Pulmonary Disease

## 2014-12-27 ENCOUNTER — Other Ambulatory Visit: Payer: Self-pay | Admitting: Licensed Clinical Social Worker

## 2014-12-27 ENCOUNTER — Other Ambulatory Visit: Payer: Self-pay | Admitting: *Deleted

## 2014-12-27 NOTE — Patient Outreach (Signed)
Assessment:  CSW called client on 12/27/14 .CSW spoke via phone with client on 12/27/14.  CSW verified identity of client.  Client spoke of in home care support through [employer] agency.  Client described to CSW her experiences with agency caregivers from [employer]. Client has not been pleased with caregiver support through [employer]. She said she had called the owner of the company and spoken with owner of [employer] about problems with in home care providers for client. Client said she was no longer receiving care through [employer] agency. She said she and her spouse were interviewing a potential caregiver to help them in the home environment. Client said she was experiencing fatigue and lethargy periodically. She said she sees Dr. Juanetta Gosling as her primary doctor.  She asked CSW to request that RN Marja Kays please call client to discuss client questions regarding need or potential need of client for xray to lung of client.  CSW informed client that CSW would request that RN Marja Kays call client to discuss questions of client regarding xray for client. CSW thanked client for phone call on 12/27/14.  Client has return number of CSW to call CSW as needed to address social work issues.Following phone call with client, CSW called RN Marja Kays on 12/27/14 and requested that she call client to discuss client's questions regarding possible xray for client. RN Marja Kays informed CSW that she would call client on 12/27/14.  Plan: Client to communicate with RN Marja Kays as needed to discuss nursing needs of client. Client to take medications as prescribed and to attend scheduled medical appointments. CSW to call client in three weeks to assess needs of client.  Kristina Horton.Kristina Horton MSW, LCSW Licensed Clinical Social Worker [facility] Care Management [phone]

## 2014-12-30 ENCOUNTER — Other Ambulatory Visit: Payer: Self-pay | Admitting: *Deleted

## 2014-12-30 NOTE — Patient Outreach (Signed)
[facility] Customer service manager [facility]) Care Management  12/30/2014  [name] [birth year] [id number]   I received a message from Kristina Horton today requesting my assistance with determining whether she should have another chest xray prior to her visit with Dr. Juanetta Gosling on 6/30. We discussed her current health status and length and she says she has a persistent cough and very low energy and feels she needs another chest xray. I advised direct contact with the office but offered to ALSO call and request a return call to [name] which I did (secure voice message left with Becky).   I will follow up with [name] and Dr. Juanetta Gosling' office in the morning.    Marja Kays MHA,BSN,RN,CCM [facility] Care Management  [phone]

## 2015-01-06 ENCOUNTER — Other Ambulatory Visit: Payer: Self-pay

## 2015-01-10 ENCOUNTER — Ambulatory Visit (HOSPITAL_COMMUNITY)
Admission: RE | Admit: 2015-01-10 | Discharge: 2015-01-10 | Disposition: A | Discharge status: Home / Self Care | Payer: Medicare PPO | Source: Ambulatory Visit | Attending: Pulmonary Disease | Admitting: Pulmonary Disease

## 2015-01-10 ENCOUNTER — Other Ambulatory Visit (HOSPITAL_COMMUNITY): Payer: Self-pay | Admitting: Pulmonary Disease

## 2015-01-10 DIAGNOSIS — R05 Cough: Secondary | ICD-10-CM | POA: Diagnosis not present

## 2015-01-10 DIAGNOSIS — R059 Cough, unspecified: Secondary | ICD-10-CM

## 2015-01-22 ENCOUNTER — Encounter: Payer: Self-pay | Admitting: *Deleted

## 2015-01-22 ENCOUNTER — Other Ambulatory Visit: Payer: Self-pay | Admitting: *Deleted

## 2015-01-22 NOTE — Patient Outreach (Signed)
[facility] Care Management  01/22/2015  Kristina DiverJaneice B Horton [birth year] [id number]  I spoke with [name]'s husband Mr. Vladimir CroftsJerry Bradmon today when I called to follow up with [name] and again try to schedule a face to face visit/appointment. I have been unable to get in touch with [name] over the last couple of weeks. Mr. Collier FlowersBradmon told me [name] and would give her the message that I called.   I will follow up with [name] by phone and offer to continue case management services if she wishes. If she prefers not to engage at this time, I will discharge her but will be happy to provide assistance at any time in the future as appropriate.    [name] MHA,BSN,RN,CCM [facility] Care Management  [phone]

## 2015-01-22 NOTE — Progress Notes (Signed)
This encounter was created in error - please disregard.

## 2015-01-24 NOTE — Patient Outreach (Signed)
[facility] Network [facility] Care Management  01/24/2015  Cyndia DiverJaneice B Dimascio 30-Aug-1945 [id number]   I spoke with Ms. Kimoto by phone today. She was very pleasant and talkative and pleased to report that she'd recently gotten good reports from her providers and was feeling much better, stating she felt she'd finally improved from her respiratory/pulmonary illness. Ms. Tawny Asalindal is looking forward to an upcoming appointment with her rheumatologist.   Ms. Tawny Asalindal told me today that she would need me to make further home visits and would call me if she had any other needs. I have notified her primary care provider.    Marja Kayslisa Gilboy MHA,BSN,RN,CCM [facility] Care Management  [phone]

## 2015-01-24 NOTE — Progress Notes (Signed)
This encounter was created in error - please disregard.

## 2015-01-28 NOTE — Patient Outreach (Signed)
[facility]) Care Management  01/28/2015  AMANDEEP HOGSTON Apr 19, 1946 [id number]   Notification from Janalyn Shy, RN to close case due to goals met with [place] Management.  Ronnell Freshwater. [place], [place] Management [place] Assistant Phone: [phone] Fax: [phone]

## 2015-02-14 ENCOUNTER — Other Ambulatory Visit: Payer: Self-pay | Admitting: Licensed Clinical Social Worker

## 2015-02-14 NOTE — Patient Outreach (Signed)
Assessment: CSW called home phone number of client on 02/14/15. CSW spoke via phone with Vladimir Crofts, spouse of client, on 02/14/15.  CSW verified identity of Vladimir Crofts.  CSW and Dorene Sorrow spoke of current needs of client. CSW informed Dorene Sorrow that RN Marja Kays had closed [facility] nursing support for client at this time.  CSW and Dorene Sorrow spoke of current social work needs of client and about in home care support for client. Dorene Sorrow said that client now has in home care support arranged and that in home support is working well for client at present.  CSW and Dorene Sorrow spoke of potential discharge of client from [facility] CSW support; however, Dorene Sorrow said that he would give message to client on 02/14/15 and request that Cyndi call CSW on 02/14/15 to discuss discharge potential for client form CSW services of [facility] program.  CSW thanked Dorene Sorrow for phone conversation on 02/14/15.  Plan: Client to call CSW on 02/14/15 at [phone] to discuss potential client discharge from [facility] CSW services. Client to take medications as prescribed and to attend scheduled medical appointments.  Kelton Pillar.Aliea Bobe MSW, LCSW Licensed Clinical Social Worker [facility] Care Management [phone]

## 2015-02-17 ENCOUNTER — Other Ambulatory Visit: Payer: Self-pay | Admitting: Licensed Clinical Social Worker

## 2015-02-17 NOTE — Patient Outreach (Signed)
Assessment: CSW had called client home phone number on 02/14/15 to discuss [facility] social work case closure with client.  CSW had spoken via phone with Kristina Horton, spouse of client, on 02/14/15.  Kristina Horton said he would give message to client on 02/14/15 and request she call CSW on 02/14/15 at [phone] to discuss case closure for client.  CSW did not receive return call from client on 02/14/15.  CSW again called client home phone number on 02/17/15. CSW left phone message for Kristina Horton on 02/17/15 and in phone message asked her to please call CSW at [phone] to discuss CSW possible case closure with client. CSW is waiting on return call from [facility].     Plan: Client to communicate with Dr. Juanetta Gosling, as needed, to discuss medical needs of client. Client to take medications as prescribed and attend scheduled medical appointments. Client to call CSW at [phone] to discuss possible client case closure from [facility] CSW services.   Kristina Horton MSW, LCSW Licensed Clinical Social Worker [facility] Care Management [phone]

## 2015-02-18 ENCOUNTER — Other Ambulatory Visit: Payer: Self-pay | Admitting: Licensed Clinical Social Worker

## 2015-02-18 ENCOUNTER — Encounter: Payer: Self-pay | Admitting: Licensed Clinical Social Worker

## 2015-02-18 NOTE — Patient Outreach (Signed)
Assessment:  CSW called home phone number of client on 02/18/15. This represents third attempt of CSW to try to  speak with client via phone  in recent days.  CSW was not able to speak via phone with client on 02/18/15. However, CSW did speak via phone with Harriet Masson, spouse of client, on 02/18/15. CSW verified identity of Harriet Masson. Sonia Side and [place] spoke of current needs of client. Sonia Side has informed CSW on several recent occasions that client does have in home care support at present (as scheduled).  Sonia Side said that in home care support is working well for client at present and that client is pleased with in home care support she receives at present. Client is attending medical appointments and has medications prescribed.  Client completed [facility] nursing support recently.  Client has not informed CSW of any further CSW needs for client.  CSW and Sonia Side had discussed client discharge from [place] services on last phone call.  Sonia Side said that client was doing well at present and was getting benefit from in home support received at present.  Sonia Side agreed with CSW plan to discharge client from [place] on 02/18/15 since social work goals of client had been met. CSW informed Sonia Side that Valisa could talk further with Dr. Luan Pulling, in future months, if Semya thought that she could again benefit from [facility] support.  CSW informed Sonia Side that Tyanne could talk with Dr. Luan Pulling if she desired to be referred again to [facility] support program. Sonia Side understood this information. He said he would share above information with Jaylea Mesta on 02/18/15.  Sonia Side was appreciative of support of [facility] program staff for client in recent months.  Plan:  CSW is discharging Haniah B. Seals on 02/18/15 from [place] since social work goals for client have been met. CSW to communicate with Lurline Del on 02/18/15 to inform Lattie Haw that [place] discharged client on 02/18/15 since client had met social work goals set. CSW sent physician case  closure letter to Dr. Luan Pulling regarding client on 02/18/15.   Norva Riffle.Gokul Waybright MSW, LCSW Licensed Clinical Social Worker [facility] Care Management [phone]

## 2015-03-12 ENCOUNTER — Other Ambulatory Visit (HOSPITAL_COMMUNITY): Payer: Self-pay | Admitting: Pulmonary Disease

## 2015-03-12 DIAGNOSIS — Z780 Asymptomatic menopausal state: Secondary | ICD-10-CM

## 2015-03-19 ENCOUNTER — Other Ambulatory Visit (HOSPITAL_COMMUNITY): Payer: Medicare PPO

## 2015-03-24 ENCOUNTER — Ambulatory Visit (HOSPITAL_COMMUNITY)
Admission: RE | Admit: 2015-03-24 | Discharge: 2015-03-24 | Disposition: A | Discharge status: Home / Self Care | Payer: Medicare PPO | Source: Ambulatory Visit | Attending: Pulmonary Disease | Admitting: Pulmonary Disease

## 2015-03-24 DIAGNOSIS — Z780 Asymptomatic menopausal state: Secondary | ICD-10-CM

## 2015-04-25 ENCOUNTER — Other Ambulatory Visit (HOSPITAL_COMMUNITY): Payer: Self-pay | Admitting: Pulmonary Disease

## 2015-04-25 ENCOUNTER — Ambulatory Visit (HOSPITAL_COMMUNITY)
Admission: RE | Admit: 2015-04-25 | Discharge: 2015-04-25 | Disposition: A | Discharge status: Home / Self Care | Payer: Medicare PPO | Source: Ambulatory Visit | Attending: Pulmonary Disease | Admitting: Pulmonary Disease

## 2015-04-25 DIAGNOSIS — M542 Cervicalgia: Secondary | ICD-10-CM | POA: Diagnosis present

## 2015-05-06 ENCOUNTER — Ambulatory Visit (HOSPITAL_COMMUNITY)
Admission: RE | Admit: 2015-05-06 | Discharge: 2015-05-06 | Disposition: A | Discharge status: Home / Self Care | Payer: Medicare PPO | Source: Ambulatory Visit | Attending: Pulmonary Disease | Admitting: Pulmonary Disease

## 2015-05-06 ENCOUNTER — Other Ambulatory Visit (HOSPITAL_COMMUNITY): Payer: Self-pay | Admitting: Pulmonary Disease

## 2015-05-06 DIAGNOSIS — R05 Cough: Secondary | ICD-10-CM | POA: Insufficient documentation

## 2015-05-06 DIAGNOSIS — R059 Cough, unspecified: Secondary | ICD-10-CM

## 2015-05-19 ENCOUNTER — Other Ambulatory Visit (HOSPITAL_COMMUNITY): Payer: Self-pay | Admitting: Orthopedic Surgery

## 2015-05-19 DIAGNOSIS — R52 Pain, unspecified: Secondary | ICD-10-CM

## 2015-05-20 ENCOUNTER — Ambulatory Visit (HOSPITAL_COMMUNITY)
Admission: RE | Admit: 2015-05-20 | Discharge: 2015-05-20 | Disposition: A | Discharge status: Home / Self Care | Payer: Medicare PPO | Source: Ambulatory Visit | Attending: Orthopedic Surgery | Admitting: Orthopedic Surgery

## 2015-05-20 DIAGNOSIS — M2341 Loose body in knee, right knee: Secondary | ICD-10-CM | POA: Insufficient documentation

## 2015-05-20 DIAGNOSIS — R937 Abnormal findings on diagnostic imaging of other parts of musculoskeletal system: Secondary | ICD-10-CM | POA: Insufficient documentation

## 2015-05-20 DIAGNOSIS — M25461 Effusion, right knee: Secondary | ICD-10-CM | POA: Insufficient documentation

## 2015-05-20 DIAGNOSIS — M25561 Pain in right knee: Secondary | ICD-10-CM | POA: Insufficient documentation

## 2015-05-20 DIAGNOSIS — R52 Pain, unspecified: Secondary | ICD-10-CM

## 2015-06-09 ENCOUNTER — Ambulatory Visit (HOSPITAL_COMMUNITY): Payer: Medicare PPO

## 2015-06-10 ENCOUNTER — Other Ambulatory Visit (HOSPITAL_COMMUNITY): Payer: Self-pay | Admitting: Pulmonary Disease

## 2015-06-10 ENCOUNTER — Ambulatory Visit (HOSPITAL_COMMUNITY)
Admission: RE | Admit: 2015-06-10 | Discharge: 2015-06-10 | Disposition: A | Discharge status: Home / Self Care | Payer: Medicare PPO | Source: Ambulatory Visit | Attending: Pulmonary Disease | Admitting: Pulmonary Disease

## 2015-06-10 DIAGNOSIS — W19XXXA Unspecified fall, initial encounter: Secondary | ICD-10-CM | POA: Diagnosis not present

## 2015-06-10 DIAGNOSIS — M25551 Pain in right hip: Secondary | ICD-10-CM | POA: Diagnosis not present

## 2015-06-10 DIAGNOSIS — Z96641 Presence of right artificial hip joint: Secondary | ICD-10-CM | POA: Insufficient documentation

## 2015-06-10 DIAGNOSIS — M25561 Pain in right knee: Secondary | ICD-10-CM | POA: Insufficient documentation

## 2015-07-16 ENCOUNTER — Other Ambulatory Visit (HOSPITAL_COMMUNITY): Payer: Self-pay | Admitting: Pulmonary Disease

## 2015-07-16 ENCOUNTER — Ambulatory Visit (HOSPITAL_COMMUNITY)
Admission: RE | Admit: 2015-07-16 | Discharge: 2015-07-16 | Disposition: A | Discharge status: Home / Self Care | Payer: Medicare Other | Source: Ambulatory Visit | Attending: Pulmonary Disease | Admitting: Pulmonary Disease

## 2015-07-16 DIAGNOSIS — M545 Low back pain: Secondary | ICD-10-CM | POA: Insufficient documentation

## 2015-07-29 ENCOUNTER — Encounter (HOSPITAL_COMMUNITY): Payer: Self-pay | Admitting: Emergency Medicine

## 2015-07-29 ENCOUNTER — Emergency Department (HOSPITAL_COMMUNITY)
Admission: EM | Admit: 2015-07-29 | Discharge: 2015-07-29 | Discharge status: Against Medical Advice | Payer: Medicare Other | Attending: Emergency Medicine | Admitting: Emergency Medicine

## 2015-07-29 DIAGNOSIS — M79651 Pain in right thigh: Secondary | ICD-10-CM | POA: Diagnosis not present

## 2015-07-29 DIAGNOSIS — M549 Dorsalgia, unspecified: Secondary | ICD-10-CM | POA: Diagnosis present

## 2015-07-29 DIAGNOSIS — M62830 Muscle spasm of back: Secondary | ICD-10-CM | POA: Insufficient documentation

## 2015-07-29 DIAGNOSIS — I10 Essential (primary) hypertension: Secondary | ICD-10-CM | POA: Insufficient documentation

## 2015-07-29 DIAGNOSIS — J449 Chronic obstructive pulmonary disease, unspecified: Secondary | ICD-10-CM | POA: Diagnosis not present

## 2015-07-29 NOTE — ED Notes (Signed)
Larey Seat on Nov. 28th 2016.  Continue to have spasm to lower back and having pain to right thigh.  Rates pain 10/10.

## 2015-08-04 ENCOUNTER — Other Ambulatory Visit: Payer: Self-pay | Admitting: Pulmonary Disease

## 2015-08-04 DIAGNOSIS — M545 Low back pain: Secondary | ICD-10-CM

## 2015-08-06 ENCOUNTER — Other Ambulatory Visit: Payer: Self-pay | Admitting: Pulmonary Disease

## 2015-08-06 ENCOUNTER — Ambulatory Visit
Admission: RE | Admit: 2015-08-06 | Discharge: 2015-08-06 | Disposition: A | Discharge status: Home / Self Care | Payer: Medicare Other | Source: Ambulatory Visit | Attending: Pulmonary Disease | Admitting: Pulmonary Disease

## 2015-08-06 DIAGNOSIS — M545 Low back pain: Secondary | ICD-10-CM

## 2015-08-11 ENCOUNTER — Encounter: Payer: Self-pay | Admitting: *Deleted

## 2015-08-20 ENCOUNTER — Other Ambulatory Visit: Payer: Self-pay | Admitting: Pulmonary Disease

## 2015-08-20 DIAGNOSIS — R9389 Abnormal findings on diagnostic imaging of other specified body structures: Secondary | ICD-10-CM

## 2015-08-25 ENCOUNTER — Ambulatory Visit
Admission: RE | Admit: 2015-08-25 | Discharge: 2015-08-25 | Disposition: A | Discharge status: Home / Self Care | Payer: Medicare Other | Source: Ambulatory Visit | Attending: Pulmonary Disease | Admitting: Pulmonary Disease

## 2015-08-25 DIAGNOSIS — R9389 Abnormal findings on diagnostic imaging of other specified body structures: Secondary | ICD-10-CM

## 2015-08-30 ENCOUNTER — Emergency Department (HOSPITAL_COMMUNITY)
Admission: EM | Admit: 2015-08-30 | Discharge: 2015-08-30 | Disposition: A | Discharge status: Home / Self Care | Payer: Medicare Other | Attending: Emergency Medicine | Admitting: Emergency Medicine

## 2015-08-30 ENCOUNTER — Encounter (HOSPITAL_COMMUNITY): Payer: Self-pay | Admitting: Emergency Medicine

## 2015-08-30 DIAGNOSIS — Z862 Personal history of diseases of the blood and blood-forming organs and certain disorders involving the immune mechanism: Secondary | ICD-10-CM | POA: Diagnosis not present

## 2015-08-30 DIAGNOSIS — J449 Chronic obstructive pulmonary disease, unspecified: Secondary | ICD-10-CM | POA: Diagnosis not present

## 2015-08-30 DIAGNOSIS — M1990 Unspecified osteoarthritis, unspecified site: Secondary | ICD-10-CM | POA: Insufficient documentation

## 2015-08-30 DIAGNOSIS — L03115 Cellulitis of right lower limb: Secondary | ICD-10-CM | POA: Diagnosis not present

## 2015-08-30 DIAGNOSIS — I10 Essential (primary) hypertension: Secondary | ICD-10-CM | POA: Insufficient documentation

## 2015-08-30 DIAGNOSIS — M7989 Other specified soft tissue disorders: Secondary | ICD-10-CM | POA: Diagnosis present

## 2015-08-30 DIAGNOSIS — K589 Irritable bowel syndrome without diarrhea: Secondary | ICD-10-CM | POA: Diagnosis not present

## 2015-08-30 DIAGNOSIS — Z7982 Long term (current) use of aspirin: Secondary | ICD-10-CM | POA: Diagnosis not present

## 2015-08-30 DIAGNOSIS — Z91040 Latex allergy status: Secondary | ICD-10-CM | POA: Insufficient documentation

## 2015-08-30 DIAGNOSIS — Z7951 Long term (current) use of inhaled steroids: Secondary | ICD-10-CM | POA: Insufficient documentation

## 2015-08-30 DIAGNOSIS — Z87891 Personal history of nicotine dependence: Secondary | ICD-10-CM | POA: Insufficient documentation

## 2015-08-30 DIAGNOSIS — Z87448 Personal history of other diseases of urinary system: Secondary | ICD-10-CM | POA: Diagnosis not present

## 2015-08-30 DIAGNOSIS — M797 Fibromyalgia: Secondary | ICD-10-CM | POA: Diagnosis not present

## 2015-08-30 DIAGNOSIS — F329 Major depressive disorder, single episode, unspecified: Secondary | ICD-10-CM | POA: Diagnosis not present

## 2015-08-30 DIAGNOSIS — Z79899 Other long term (current) drug therapy: Secondary | ICD-10-CM | POA: Insufficient documentation

## 2015-08-30 MED ORDER — CEPHALEXIN 500 MG PO CAPS
500.0000 mg | ORAL_CAPSULE | Freq: Four times a day (QID) | ORAL | Status: DC
Start: 2015-08-30 — End: 2017-03-24

## 2015-08-30 MED ORDER — CEPHALEXIN 500 MG PO CAPS
1000.0000 mg | ORAL_CAPSULE | Freq: Once | ORAL | Status: AC
  Administered 2015-08-30: 1000 mg via ORAL
  Filled 2015-08-30: qty 2

## 2015-08-30 MED ORDER — ENOXAPARIN SODIUM 80 MG/0.8ML ~~LOC~~ SOLN
1.0000 mg/kg | Freq: Once | SUBCUTANEOUS | Status: AC
  Administered 2015-08-30: 80 mg via SUBCUTANEOUS
  Filled 2015-08-30: qty 0.8

## 2015-08-30 NOTE — Discharge Instructions (Signed)
It was our pleasure to provide your ER care today - we hope that you feel better.  Elevate leg to help with swelling.  Take antibiotic (keflex) as prescribed.  Return at 9 AM for ultrasound to make sure there is no blood clot.  Return to ER right away if worse, severe or intractable pain to leg, quickly spreading redness, high fevers, chest pain, trouble breathing, other concern.

## 2015-08-30 NOTE — ED Notes (Signed)
Patient verbalizes understanding of discharge instructions, prescription medications, home care and follow up care. Patient out of department at this time. 

## 2015-08-30 NOTE — ED Notes (Signed)
Patient c/o right leg pain with swelling and discoloration. Per patient PCP (DR Juanetta Gosling) wants her to be evaluated for DVT. Per patient only takes  aspirin a day. Pedal pulse present. Temp of foot WNL. Unable to assess capillary refill in triage due to toenail polish.

## 2015-08-30 NOTE — ED Provider Notes (Addendum)
CSN: [id number]     Arrival date & time 08/30/15  1700 History   First MD Initiated Contact with Patient 08/30/15 1901     Chief Complaint  Patient presents with  . Claudication     (Consider location/radiation/quality/duration/timing/severity/associated sxs/prior Treatment) The history is provided by the patient.  Patient c/o right lower leg swelling for the past few weeks, and states in past week is now red and warm.  Denies hx cellulitis or similar symptoms. No hx dvt or pe.  No chest pain or sob.  Symptoms constant, persistent since onset, moderate, no specific exacerbation or alleviating factors.  No leg numbness/weakness. Mild pain to leg diffusely. No focal calf pain or claudication. denies fever or chills. No nv, does not feel sick/ill.        Past Medical History  Diagnosis Date  . Essential hypertension, benign   . IBS (irritable bowel syndrome)   . IC (interstitial cystitis)   . Allergic rhinitis   . Fibromyalgia   . Osteoarthritis   . Iron deficiency anemia 05/25/2012    Requiring IV Feraheme   . Gastroparesis     2014  . DJD (degenerative joint disease)   . Depression   . Difficult intubation   . PONV (postoperative nausea and vomiting)   . COPD (chronic obstructive pulmonary disease) [facility])    Past Surgical History  Procedure Laterality Date  . Spinal fusion  2010    C2-T2, done in [place]  . Vesicovaginal fistula closure w/ tah  1998  . Appendectomy  1974  . Breast surgery  1962    benign rumor  . Thyroidectomy, partial    . Vesico-vaginal fistula repair  1997  . Lumbar fusion      2011  . Anterior cervical decomp/discectomy fusion    . Colonoscopy with propofol  05/29/2012    Procedure: COLONOSCOPY WITH PROPOFOL;  Surgeon: Petra Kuba, MD;  Location: [facility] ENDOSCOPY;  Service: Endoscopy;  Laterality: N/A;  needs general  . Esophagogastroduodenoscopy (egd) with propofol  05/29/2012    Procedure: ESOPHAGOGASTRODUODENOSCOPY (EGD) WITH PROPOFOL;   Surgeon: Petra Kuba, MD;  Location: [facility] ENDOSCOPY;  Service: Endoscopy;  Laterality: N/A;   Family History  Problem Relation Age of Onset  . COPD Mother   . Heart failure Mother   . Alcohol abuse Mother   . Diabetes Mother   . Heart failure Father   . Alcohol abuse Brother    Social History  Substance Use Topics  . Smoking status: Former Smoker -- 3.00 packs/day for 18 years    Types: Cigarettes    Quit date: 07/12/1978  . Smokeless tobacco: Never Used  . Alcohol Use: No   OB History    No data available     Review of Systems  Constitutional: Negative for fever and chills.  HENT: Negative for sore throat.   Eyes: Negative for redness.  Respiratory: Negative for shortness of breath.   Cardiovascular: Negative for chest pain and leg swelling.  Gastrointestinal: Negative for vomiting, abdominal pain and diarrhea.  Genitourinary: Negative for flank pain.  Musculoskeletal: Negative for back pain and neck pain.  Skin: Negative for rash.  Neurological: Negative for weakness, numbness and headaches.  Hematological: Does not bruise/bleed easily.  Psychiatric/Behavioral: Negative for confusion.      Allergies  Augmentin; D-xylitol; Fluconazole; Iodine; Latex; Meperidine; Other; Selenium; Shellfish-derived products; Sulfa antibiotics; Tape; Ciprofloxacin; Clindamycin/lincomycin; Demerol; Sulfa drugs cross reactors; and Tizanidine  Home Medications   Prior to Admission medications  Medication Sig Start Date End Date Taking? Authorizing Provider  acyclovir (ZOVIRAX) 400 MG tablet Take 400 mg by mouth 2 (two) times daily.      Historical Provider, MD  ALPRAZolam Prudy Feeler) 0.25 MG tablet Take 0.25 mg by mouth 3 (three) times daily as needed. And every night.    Historical Provider, MD  amLODipine (NORVASC) 5 MG tablet Take 5 mg by mouth daily. 10/02/14   Historical Provider, MD  aspirin EC 81 MG tablet Take 81 mg by mouth at bedtime.    Historical Provider, MD  bisacodyl  (BISACODYL) 5 MG EC tablet Take 5 mg by mouth daily as needed. For stool softener    Historical Provider, MD  buPROPion (WELLBUTRIN XL) 300 MG 24 hr tablet Take 300 mg by mouth at bedtime.     Historical Provider, MD  cetirizine (ZYRTEC) 10 MG tablet Take 10 mg by mouth at bedtime.  10/11/11   Storm Frisk, MD  Cholecalciferol (VITAMIN D-3) 1000 UNITS CAPS Take 1,000 Units by mouth at bedtime.     Historical Provider, MD  clidinium-chlordiazePOXIDE (LIBRAX) 5-2.5 MG per capsule Take 1 capsule by mouth 2 (two) times daily as needed.     Historical Provider, MD  Diclofenac Sodium 1.5 % SOLN Place 40 drops onto the skin 4 (four) times daily as needed (pain).     Historical Provider, MD  DULoxetine (CYMBALTA) 60 MG capsule Take 60 mg by mouth daily.  05/22/07   Historical Provider, MD  EPINEPHrine (EPIPEN 2-PAK) 0.3 mg/0.3 mL DEVI Inject 0.3 mg into the muscle once. To food allergies, delayed reaction    Historical Provider, MD  fentaNYL (DURAGESIC - DOSED MCG/HR) 75 MCG/HR Place 75 mcg onto the skin every 3 (three) days.  09/18/14   Historical Provider, MD  fluticasone (CUTIVATE) 0.05 % cream Apply 1 application topically 2 (two) times daily.  10/03/14   Historical Provider, MD  HYDROcodone-homatropine (HYCODAN) 5-1.5 MG/5ML syrup Take 5 mLs by mouth every 4 (four) hours as needed. For cough    Historical Provider, MD  hydrOXYzine (VISTARIL) 25 MG capsule Take 1 capsule (25 mg total) by mouth 3 (three) times daily as needed. 08/20/13   Len Blalock, NP  ketoconazole (NIZORAL) 2 % cream Apply 1 application topically 2 (two) times daily.  10/03/14   Historical Provider, MD  levalbuterol Pauline Aus HFA) 45 MCG/ACT inhaler Inhale 2 puffs into the lungs every 6 (six) hours as needed.     Historical Provider, MD  levothyroxine (SYNTHROID, LEVOTHROID) 75 MCG tablet Take 75 mcg by mouth every morning.     Historical Provider, MD  Lidocaine, Anorectal, 5 % CREA Apply topically daily as needed. 04/21/05   Historical  Provider, MD  Magnesium Hydroxide (PHILLIPS MILK OF MAGNESIA PO) Take 1-2 capsules by mouth at bedtime.     Historical Provider, MD  meloxicam (MOBIC) 15 MG tablet Take 15 mg by mouth at bedtime.  09/27/14   Historical Provider, MD  methylphenidate (RITALIN) 20 MG tablet Take 50 mg by mouth every morning.     Historical Provider, MD  montelukast (SINGULAIR) 10 MG tablet Take 10 mg by mouth at bedtime.    Historical Provider, MD  ondansetron (ZOFRAN-ODT) 8 MG disintegrating tablet Take 8 mg by mouth every 6 (six) hours as needed for nausea or vomiting.     Historical Provider, MD  oxycodone (OXY-IR) 5 MG capsule Take 10 mg by mouth every 4 (four) hours as needed. For break through pain    Historical  Provider, MD  pentosan polysulfate (ELMIRON) 100 MG capsule Take 200 mg by mouth 2 (two) times daily.     Historical Provider, MD  potassium chloride (KLOR-CON) 10 MEQ CR tablet Take 30 mEq by mouth 3 (three) times daily. Patient takes up to 70-80 meq daily    Historical Provider, MD  Probiotic Product (ACIDOPHILUS/GOAT MILK) CAPS Take 1 tablet by mouth at bedtime. Ultimate Flora    Historical Provider, MD  RABEprazole (ACIPHEX) 20 MG tablet 40 mg twice daily    Historical Provider, MD  Respiratory Therapy Supplies (FLUTTER) DEVI Use as directed 11/15/14   Nyoka Cowden, MD  sodium chloride (OCEAN) 0.65 % nasal spray Place 1 spray into the nose as needed. For nasal decongestion    Historical Provider, MD  talc powder Apply 1 application topically as needed.    Historical Provider, MD  topiramate (TOPAMAX) 50 MG tablet Take 50 mg by mouth 3 (three) times daily.     Historical Provider, MD  triamcinolone (NASACORT) 55 MCG/ACT nasal inhaler Place 2 sprays into the nose as needed. 05/12/12   Leslye Peer, MD  triamterene-hydrochlorothiazide (MAXZIDE) 75-50 MG per tablet Take 1 tablet by mouth every morning.     Historical Provider, MD  valsartan (DIOVAN) 320 MG tablet Take 320 mg by mouth daily.    Historical  Provider, MD   BP 173/78 mmHg  Pulse 106  Temp(Src) 98.5 F (36.9 C) (Oral)  Resp 18  Ht  (1.448 m)  Wt 81.194 kg  BMI 38.72 kg/m2  SpO2 98% Physical Exam  Constitutional: She appears well-developed and well-nourished. No distress.  HENT:  Mouth/Throat: Oropharynx is clear and moist.  Eyes: Conjunctivae are normal. No scleral icterus.  Neck: Neck supple. No tracheal deviation present.  Cardiovascular: Normal rate, regular rhythm, normal heart sounds and intact distal pulses.  Exam reveals no gallop and no friction rub.   No murmur heard. Pulmonary/Chest: Effort normal and breath sounds normal. No respiratory distress.  Abdominal: Soft. Normal appearance and bowel sounds are normal. She exhibits no distension. There is no tenderness.  Musculoskeletal:  Mild right lower leg swelling.  +increased warmth, erythema to right lower leg. Distal pulses palp. Good passive rom at ankle, knee and hip without pain.   Neurological: She is alert.  Skin: Skin is warm and dry. No rash noted.  Psychiatric: She has a normal mood and affect.  Nursing note and vitals reviewed.   ED Course  Procedures (including critical care time)   MDM   Reviewed nursing notes and prior charts for additional history.   Radiology/us indicates unable to get vascular ultrasound in evening/night, and to have pt return in AM for study.   Given erythema/warmth, suspected cellulitis will rx keflex. Dose given in ED.  Given concern for possible dvt, will give lovenox and have return in AM for ultrasound.  No cp or sob.  Pt currently appears stable for d/c.       Cathren Laine, MD 08/30/15 2003

## 2015-08-31 ENCOUNTER — Ambulatory Visit (HOSPITAL_COMMUNITY)
Admission: RE | Admit: 2015-08-31 | Discharge: 2015-08-31 | Disposition: A | Discharge status: Home / Self Care | Payer: Medicare Other | Source: Ambulatory Visit | Attending: Emergency Medicine | Admitting: Emergency Medicine

## 2015-08-31 ENCOUNTER — Inpatient Hospital Stay (HOSPITAL_COMMUNITY): Admit: 2015-08-31 | Payer: Medicare Other

## 2015-08-31 DIAGNOSIS — R600 Localized edema: Secondary | ICD-10-CM | POA: Insufficient documentation

## 2015-08-31 DIAGNOSIS — M79604 Pain in right leg: Secondary | ICD-10-CM | POA: Diagnosis not present

## 2015-08-31 NOTE — ED Provider Notes (Signed)
I explained [place] results to the pt The pt states that she understands her results. She will f/u with PCP  Eber Hong, MD 08/31/15 1540

## 2015-09-07 ENCOUNTER — Encounter (HOSPITAL_COMMUNITY): Payer: Self-pay | Admitting: Emergency Medicine

## 2015-09-07 ENCOUNTER — Emergency Department (HOSPITAL_COMMUNITY)
Admission: EM | Admit: 2015-09-07 | Discharge: 2015-09-07 | Disposition: A | Discharge status: Home / Self Care | Payer: Medicare Other | Attending: Emergency Medicine | Admitting: Emergency Medicine

## 2015-09-07 ENCOUNTER — Emergency Department (HOSPITAL_COMMUNITY): Payer: Medicare Other

## 2015-09-07 DIAGNOSIS — Y9289 Other specified places as the place of occurrence of the external cause: Secondary | ICD-10-CM | POA: Insufficient documentation

## 2015-09-07 DIAGNOSIS — W1839XA Other fall on same level, initial encounter: Secondary | ICD-10-CM | POA: Insufficient documentation

## 2015-09-07 DIAGNOSIS — W19XXXA Unspecified fall, initial encounter: Secondary | ICD-10-CM

## 2015-09-07 DIAGNOSIS — Z791 Long term (current) use of non-steroidal anti-inflammatories (NSAID): Secondary | ICD-10-CM | POA: Diagnosis not present

## 2015-09-07 DIAGNOSIS — Y9389 Activity, other specified: Secondary | ICD-10-CM | POA: Insufficient documentation

## 2015-09-07 DIAGNOSIS — F329 Major depressive disorder, single episode, unspecified: Secondary | ICD-10-CM | POA: Diagnosis not present

## 2015-09-07 DIAGNOSIS — S3992XA Unspecified injury of lower back, initial encounter: Secondary | ICD-10-CM | POA: Insufficient documentation

## 2015-09-07 DIAGNOSIS — D509 Iron deficiency anemia, unspecified: Secondary | ICD-10-CM | POA: Diagnosis not present

## 2015-09-07 DIAGNOSIS — Z792 Long term (current) use of antibiotics: Secondary | ICD-10-CM | POA: Diagnosis not present

## 2015-09-07 DIAGNOSIS — Z7982 Long term (current) use of aspirin: Secondary | ICD-10-CM | POA: Diagnosis not present

## 2015-09-07 DIAGNOSIS — M797 Fibromyalgia: Secondary | ICD-10-CM | POA: Diagnosis not present

## 2015-09-07 DIAGNOSIS — Z79899 Other long term (current) drug therapy: Secondary | ICD-10-CM | POA: Insufficient documentation

## 2015-09-07 DIAGNOSIS — Z87891 Personal history of nicotine dependence: Secondary | ICD-10-CM | POA: Diagnosis not present

## 2015-09-07 DIAGNOSIS — Y998 Other external cause status: Secondary | ICD-10-CM | POA: Insufficient documentation

## 2015-09-07 DIAGNOSIS — J449 Chronic obstructive pulmonary disease, unspecified: Secondary | ICD-10-CM | POA: Insufficient documentation

## 2015-09-07 DIAGNOSIS — I10 Essential (primary) hypertension: Secondary | ICD-10-CM | POA: Insufficient documentation

## 2015-09-07 LAB — COMPREHENSIVE METABOLIC PANEL
ALBUMIN: 3.6 g/dL (ref 3.5–5.0)
ALT: 13 U/L — ABNORMAL LOW (ref 14–54)
ANION GAP: 13 (ref 5–15)
AST: 21 U/L (ref 15–41)
Alkaline Phosphatase: 92 U/L (ref 38–126)
BUN: 15 mg/dL (ref 6–20)
CALCIUM: 9.6 mg/dL (ref 8.9–10.3)
CHLORIDE: 96 mmol/L — AB (ref 101–111)
CO2: 28 mmol/L (ref 22–32)
Creatinine, Ser: 0.98 mg/dL (ref 0.44–1.00)
GFR calc non Af Amer: 57 mL/min — ABNORMAL LOW (ref >60)
GLUCOSE: 141 mg/dL — AB (ref 65–99)
POTASSIUM: 4.8 mmol/L (ref 3.5–5.1)
Sodium: 137 mmol/L (ref 135–145)
Total Bilirubin: 0.7 mg/dL (ref 0.3–1.2)
Total Protein: 6.8 g/dL (ref 6.5–8.1)

## 2015-09-07 LAB — I-STAT CHEM 8, ED
BUN: 20 mg/dL (ref 6–20)
CALCIUM ION: 1.05 mmol/L — AB (ref 1.13–1.30)
CHLORIDE: 97 mmol/L — AB (ref 101–111)
CREATININE: 0.8 mg/dL (ref 0.44–1.00)
GLUCOSE: 100 mg/dL — AB (ref 65–99)
HCT: 35 % — ABNORMAL LOW (ref 36.0–46.0)
Hemoglobin: 11.9 g/dL — ABNORMAL LOW (ref 12.0–15.0)
Potassium: 4.9 mmol/L (ref 3.5–5.1)
Sodium: 135 mmol/L (ref 135–145)
TCO2: 33 mmol/L (ref 0–100)

## 2015-09-07 LAB — CBC WITH DIFFERENTIAL/PLATELET
Basophils Absolute: 0 10*3/uL (ref 0.0–0.1)
Basophils Relative: 0 %
EOS ABS: 0.2 10*3/uL (ref 0.0–0.7)
EOS PCT: 2 %
HCT: 32.6 % — ABNORMAL LOW (ref 36.0–46.0)
Hemoglobin: 10.1 g/dL — ABNORMAL LOW (ref 12.0–15.0)
LYMPHS ABS: 1 10*3/uL (ref 0.7–4.0)
Lymphocytes Relative: 11 %
MCH: 28.3 pg (ref 26.0–34.0)
MCHC: 31 g/dL (ref 30.0–36.0)
MCV: 91.3 fL (ref 78.0–100.0)
MONO ABS: 0.8 10*3/uL (ref 0.1–1.0)
Monocytes Relative: 8 %
Neutro Abs: 7.5 10*3/uL (ref 1.7–7.7)
Neutrophils Relative %: 79 %
PLATELETS: 326 10*3/uL (ref 150–400)
RBC: 3.57 MIL/uL — AB (ref 3.87–5.11)
RDW: 14.4 % (ref 11.5–15.5)
WBC: 9.5 10*3/uL (ref 4.0–10.5)

## 2015-09-07 LAB — I-STAT CG4 LACTIC ACID, ED
LACTIC ACID, VENOUS: 1.83 mmol/L (ref 0.5–2.0)
LACTIC ACID, VENOUS: 1.19 mmol/L (ref 0.5–2.0)

## 2015-09-07 NOTE — ED Notes (Signed)
Pt reports she thinks she had a concussion. Pt reports falls X4 or 5. Pt kept eyes closed during assessment. Pt assisted to bed with RNX 2 and 1 EMT.

## 2015-09-07 NOTE — ED Notes (Signed)
Pt from home with c/o pain from falls every day due to bruises.  Pt states she "slides out of bed just like I'm sliding out of this wheel chair."  Pt "drapped and hanging" out of the wheelchair.  Pt able to reposition and sit normally with assistance for only a few minutes.  Family reports pt falling asleep during eating and trouble staying awake starting this weekend.  Recent cellulitis dx and taking ABX currently.  Reports it doesn't appear to be getting worse.  NAD, A&O.

## 2015-09-07 NOTE — Discharge Instructions (Signed)
Stop using her fentanyl patch and follow-up with your family doctor this week
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
## 2015-09-07 NOTE — ED Notes (Signed)
Pt fentanyl patch placed by husband was removed and cut and flushed down the toilet with witness Gena Fray per MD request.

## 2015-09-07 NOTE — ED Provider Notes (Signed)
CSN: [id number]     Arrival date & time 09/07/15  1340 History   First MD Initiated Contact with Patient 09/07/15 1442     Chief Complaint  Patient presents with  . Back Pain  . Fall     (Consider location/radiation/quality/duration/timing/severity/associated sxs/prior Treatment) Patient is a 70 y.o. female presenting with back pain and fall. The history is provided by the patient and a relative (The patient and the friend states that she has fallen multiple times in a week. He states that she's been very sleepy and confused lately).  Back Pain Location:  Lumbar spine Quality:  Aching Radiates to:  Does not radiate Pain severity:  Moderate Pain is:  Same all the time Onset quality:  Sudden Timing:  Constant Progression:  Waxing and waning Associated symptoms: weakness   Associated symptoms: no abdominal pain, no chest pain and no headaches   Fall Pertinent negatives include no chest pain, no abdominal pain and no headaches.    Past Medical History  Diagnosis Date  . Essential hypertension, benign   . IBS (irritable bowel syndrome)   . IC (interstitial cystitis)   . Allergic rhinitis   . Fibromyalgia   . Osteoarthritis   . Iron deficiency anemia 05/25/2012    Requiring IV Feraheme   . Gastroparesis     2014  . DJD (degenerative joint disease)   . Depression   . Difficult intubation   . PONV (postoperative nausea and vomiting)   . COPD (chronic obstructive pulmonary disease) [facility])    Past Surgical History  Procedure Laterality Date  . Spinal fusion  2010    C2-T2, done in [place]  . Vesicovaginal fistula closure w/ tah  1998  . Appendectomy  1974  . Breast surgery  1962    benign rumor  . Thyroidectomy, partial    . Vesico-vaginal fistula repair  1997  . Lumbar fusion      2011  . Anterior cervical decomp/discectomy fusion    . Colonoscopy with propofol  05/29/2012    Procedure: COLONOSCOPY WITH PROPOFOL;  Surgeon: Petra Kuba, MD;  Location: [facility];   Service: Endoscopy;  Laterality: N/A;  needs general  . Esophagogastroduodenoscopy (egd) with propofol  05/29/2012    Procedure: ESOPHAGOGASTRODUODENOSCOPY (EGD) WITH PROPOFOL;  Surgeon: Petra Kuba, MD;  Location: [facility];  Service: Endoscopy;  Laterality: N/A;   Family History  Problem Relation Age of Onset  . COPD Mother   . Heart failure Mother   . Alcohol abuse Mother   . Diabetes Mother   . Heart failure Father   . Alcohol abuse Brother    Social History  Substance Use Topics  . Smoking status: Former Smoker -- 3.00 packs/day for 18 years    Types: Cigarettes    Quit date: 07/12/1978  . Smokeless tobacco: Never Used  . Alcohol Use: No   OB History    No data available     Review of Systems  Constitutional: Negative for appetite change and fatigue.  HENT: Negative for congestion, ear discharge and sinus pressure.   Eyes: Negative for discharge.  Respiratory: Negative for cough.   Cardiovascular: Negative for chest pain.  Gastrointestinal: Negative for abdominal pain and diarrhea.  Genitourinary: Negative for frequency and hematuria.  Musculoskeletal: Positive for back pain.  Skin: Negative for rash.  Neurological: Positive for weakness. Negative for seizures and headaches.  Psychiatric/Behavioral: Negative for hallucinations.      Allergies  Augmentin; Iodine; Shellfish-derived products; D-xylitol; Fluconazole; Latex; Meperidine;  Other; Selenium; Sulfa antibiotics; Tape; Clindamycin/lincomycin; Demerol; Sulfa drugs cross reactors; and Tizanidine  Home Medications   Prior to Admission medications   Medication Sig Start Date End Date Taking? Authorizing Provider  acyclovir (ZOVIRAX) 400 MG tablet Take 400 mg by mouth 2 (two) times daily. After breakfast and at bedtime   Yes Historical Provider, MD  ALPRAZolam (XANAX) 0.25 MG tablet Take 0.25 mg by mouth See admin instructions. Take 1 tablet (0.25 mg) by mouth daily at bedtime, may also take 1 tablet (0.25 mg)  twice daily as needed for anxiety   Yes Historical Provider, MD  amLODipine (NORVASC) 5 MG tablet Take 5 mg by mouth daily after breakfast.  10/02/14  Yes Historical Provider, MD  aspirin EC 81 MG tablet Take 81 mg by mouth at bedtime.   Yes Historical Provider, MD  baclofen (LIORESAL) 20 MG tablet Take 20 mg by mouth 3 (three) times daily as needed for muscle spasms.   Yes Historical Provider, MD  bisacodyl (BISACODYL) 5 MG EC tablet Take 5-10 mg by mouth at bedtime. For stool softener   Yes Historical Provider, MD  buPROPion (WELLBUTRIN XL) 300 MG 24 hr tablet Take 300 mg by mouth daily after supper.    Yes Historical Provider, MD  cetirizine (ZYRTEC) 10 MG tablet Take 10 mg by mouth daily after supper.  10/11/11  Yes Storm Frisk, MD  Cholecalciferol (VITAMIN D-3) 1000 UNITS CAPS Take 2,000 Units by mouth at bedtime.    Yes Historical Provider, MD  clidinium-chlordiazePOXIDE (LIBRAX) 5-2.5 MG per capsule Take 1 capsule by mouth 2 (two) times daily as needed (irritable bowel symptoms).    Yes Historical Provider, MD  doxycycline (VIBRA-TABS) 100 MG tablet Take 100 mg by mouth 2 (two) times daily. 15 day course filled 09/02/15   Yes Historical Provider, MD  DULoxetine (CYMBALTA) 30 MG capsule Take 30 mg by mouth daily. Take with a 60 mg capsule for a 90 mg dose   Yes Historical Provider, MD  DULoxetine (CYMBALTA) 60 MG capsule Take 60 mg by mouth daily after breakfast. Take with a 30 mg capsule for a 60 mg dose 05/22/07  Yes Historical Provider, MD  EPINEPHrine (EPIPEN 2-PAK) 0.3 mg/0.3 mL DEVI Inject 0.3 mg into the muscle daily as needed (severe food allergies (usually occur a couple hours after ingestion)).    Yes Historical Provider, MD  fentaNYL (DURAGESIC - DOSED MCG/HR) 75 MCG/HR Place 75 mcg onto the skin every 3 (three) days.  09/18/14  Yes Historical Provider, MD  fluconazole (DIFLUCAN) 150 MG tablet Take 150 mg by mouth daily. 5 day course started 09/03/15   Yes Historical Provider, MD   levalbuterol [facility] HFA) 45 MCG/ACT inhaler Inhale 2 puffs into the lungs every 6 (six) hours as needed for wheezing or shortness of breath.    Yes Historical Provider, MD  levothyroxine (SYNTHROID, LEVOTHROID) 75 MCG tablet Take 75 mcg by mouth daily before breakfast.    Yes Historical Provider, MD  Magnesium Hydroxide (PHILLIPS MILK OF MAGNESIA PO) Take 500-1,000 mg by mouth at bedtime.   Yes Historical Provider, MD  meloxicam (MOBIC) 15 MG tablet Take 15 mg by mouth daily after supper.  09/27/14  Yes Historical Provider, MD  methylphenidate (RITALIN) 20 MG tablet Take 20-40 mg by mouth See admin instructions. Take 1 tablet (20 mg) by mouth before breakfast and 2 tablets (40 mg) after breakfast (approx 10am)   Yes Historical Provider, MD  Multiple Vitamins-Minerals (PRESERVISION AREDS 2) CAPS Take 1 capsule  by mouth at bedtime.   Yes Historical Provider, MD  ondansetron (ZOFRAN-ODT) 8 MG disintegrating tablet Take 8 mg by mouth every 6 (six) hours as needed for nausea or vomiting.    Yes Historical Provider, MD  oxycodone (OXY-IR) 5 MG capsule Take 10 mg by mouth every 4 (four) hours as needed (breakthrough pain).    Yes Historical Provider, MD  pentosan polysulfate (ELMIRON) 100 MG capsule Take 200 mg by mouth 2 (two) times daily. Before breakfast and at bedtime   Yes Historical Provider, MD  potassium chloride (KLOR-CON) 10 MEQ CR tablet Take 30 mEq by mouth 2 (two) times daily after a meal. After breakfast and supper   Yes Historical Provider, MD  PRESCRIPTION MEDICATION Apply 1 application topically 2 (two) times daily as needed (chafing between legs). Fluticasone prop 0.05% cream + ketoconazole 2% cream compounded at pharmacy   Yes Historical Provider, MD  Probiotic Product (PROBIOTIC PO) Take 1 capsule by mouth at bedtime.   Yes Historical Provider, MD  RABEprazole (ACIPHEX) 20 MG tablet Take 40 mg by mouth 2 (two) times daily. Before breakfast and at bedtime   Yes Historical Provider, MD   Respiratory Therapy Supplies (FLUTTER) DEVI Use as directed 11/15/14  Yes Nyoka Cowden, MD  sodium chloride (OCEAN) 0.65 % nasal spray Place 1 spray into the nose daily as needed for congestion.    Yes Historical Provider, MD  talc (ZEASORB) powder Apply 1 application topically 2 (two) times daily as needed (chafing between legs).   Yes Historical Provider, MD  triamcinolone (NASACORT) 55 MCG/ACT nasal inhaler Place 2 sprays into the nose 2 (two) times daily as needed (congestion).  05/12/12  Yes Leslye Peer, MD  triamterene-hydrochlorothiazide (MAXZIDE) 75-50 MG per tablet Take 1 tablet by mouth daily after breakfast.    Yes Historical Provider, MD  valsartan (DIOVAN) 320 MG tablet Take 320 mg by mouth daily after breakfast.    Yes Historical Provider, MD  cephALEXin (KEFLEX) 500 MG capsule Take 1 capsule (500 mg total) by mouth 4 (four) times daily. Patient not taking: Reported on 09/07/2015 08/30/15   Cathren Laine, MD  hydrOXYzine (VISTARIL) 25 MG capsule Take 1 capsule (25 mg total) by mouth 3 (three) times daily as needed. Patient not taking: Reported on 09/07/2015 08/20/13   Len Blalock, NP   BP 137/86 mmHg  Pulse 97  Temp(Src) 98.5 F (36.9 C) (Oral)  Resp 22  SpO2 96% Physical Exam  Constitutional: She is oriented to person, place, and time. She appears well-developed.  HENT:  Head: Normocephalic.  Pupils pinpoint  Eyes: Conjunctivae and EOM are normal. No scleral icterus.  Neck: Neck supple. No thyromegaly present.  Cardiovascular: Normal rate and regular rhythm.  Exam reveals no gallop and no friction rub.   No murmur heard. Pulmonary/Chest: No stridor. She has no wheezes. She has no rales. She exhibits no tenderness.  Abdominal: She exhibits no distension. There is no tenderness. There is no rebound.  Musculoskeletal: Normal range of motion. She exhibits tenderness. She exhibits no edema.  Tender lumbar spine  Lymphadenopathy:    She has no cervical adenopathy.   Neurological: She is oriented to person, place, and time. She exhibits normal muscle tone. Coordination normal.  Patient very lethargic but able to answer questions  Skin: No rash noted. No erythema.  Psychiatric: She has a normal mood and affect. Her behavior is normal.    ED Course  Procedures (including critical care time) Labs Review Labs Reviewed  COMPREHENSIVE METABOLIC PANEL - Abnormal; Notable for the following:    Chloride 96 (*)    Glucose, Bld 141 (*)    ALT 13 (*)    GFR calc non Af Amer 57 (*)    All other components within normal limits  CBC WITH DIFFERENTIAL/PLATELET - Abnormal; Notable for the following:    RBC 3.57 (*)    Hemoglobin 10.1 (*)    HCT 32.6 (*)    All other components within normal limits  I-STAT CHEM 8, ED - Abnormal; Notable for the following:    Chloride 97 (*)    Glucose, Bld 100 (*)    Calcium, Ion 1.05 (*)    Hemoglobin 11.9 (*)    HCT 35.0 (*)    All other components within normal limits  I-STAT CG4 LACTIC ACID, ED  I-STAT CG4 LACTIC ACID, ED  I-STAT CG4 LACTIC ACID, ED    Imaging Review Ct Head Wo Contrast  09/07/2015  CLINICAL DATA:  70 year old with multiple recent falls at home, presenting with headache. Initial encounter. EXAM: CT HEAD WITHOUT CONTRAST TECHNIQUE: Contiguous axial images were obtained from the base of the skull through the vertex without intravenous contrast. COMPARISON:  None. FINDINGS: Motion degraded several of the images initially but these were repeated and a diagnostic study was obtained. No significant atrophy for patient age. Ventricular system normal in size and appearance for age. Mild changes of small vessel disease of the white matter diffusely, including the pons and brainstem. No mass lesion. No midline shift. No acute hemorrhage or hematoma. No extra-axial fluid collections. No evidence of acute infarction. No skull fracture or other focal osseous abnormality involving the skull. Visualized paranasal  sinuses, bilateral mastoid air cells and bilateral middle ear cavities well-aerated. Moderate bilateral carotid siphon atherosclerosis. IMPRESSION: 1. No acute intracranial abnormality. 2. Mild chronic microvascular ischemic changes of the white matter, including the pons and brainstem. Electronically Signed   By: Hulan Saas M.D.   On: 09/07/2015 16:49   Ct Lumbar Spine Wo Contrast  09/07/2015  CLINICAL DATA:  Falls.  Back pain. EXAM: CT LUMBAR SPINE WITHOUT CONTRAST TECHNIQUE: Multidetector CT imaging of the lumbar spine was performed without intravenous contrast administration. Multiplanar CT image reconstructions were also generated. COMPARISON:  08/25/2015 FINDINGS: Anterior wedging at T12 no change from prior the, with endplate irregularity and vertebral sclerosis. There is vertebral sclerosis at L1 with vertebral augmentation inferiorly within the vertebral body and tracking along the anterior epidural space, no change from prior. Grade 1 degenerative retrolisthesis at T12- L1, L1- 2, and L2-3 with stable anterolisthesis at L4-5. Posterolateral rod and pedicle screw fixation at L3-L4-L5-S1, no change from prior, with solid interbody bony bridging at L4-5 and spacer/graft material at L3-4 and L5-S1. Bony demineralization. No findings of loosening or infection along the pedicle screws. Posterior decompression at all levels between L3 and S1. Nonobstructive left nephrolithiasis. Expanded in dense right psoas muscle favoring psoas hematoma, mildly further expanded at the level of the iliac crests compared to 08/25/15. No new fracture. There is osseous foraminal stenosis on the left at L2-3, and on the right at T11-12, T12-L1, L1- 2, and L2-3 due to intervertebral and facet spurring. IMPRESSION: 1. Mild enlargement of the right psoas muscle hematoma compared to the exam from 08/25/2015. 2. Stable compression fracture at T12 with endplate irregularity unchanged from prior. Stable compression and vertebral  augmentation at L1. No change in the appearance of the L3-L4-L5-S1 fusion and posterior decompression. 3. Multilevel foraminal impingement due  to spurring. Electronically Signed   By: Gaylyn Rong M.D.   On: 09/07/2015 16:57   I have personally reviewed and evaluated these images and lab results as part of my medical decision-making.   EKG Interpretation None      MDM   Final diagnoses:  Fall, initial encounter    Lab work unremarkable CT of the head normal CT of the lumbar spine shows old injuries to the spine along with a psoas muscle hematoma that she had before which is slightly enlarged her family doctor is following that. I took the fentanyl patch off the patient and  2 hours later she was much more awake. I suspect her lethargy is related to too much narcotics. Patient was instructed not to use the fentanyl patch anymore she has oxycodone to take for pain she will use that medicine and follow-up with her PCP this week    Bethann Berkshire, MD 09/07/15 1715

## 2015-10-03 ENCOUNTER — Other Ambulatory Visit (HOSPITAL_COMMUNITY): Payer: Self-pay | Admitting: Pulmonary Disease

## 2015-10-03 DIAGNOSIS — M7981 Nontraumatic hematoma of soft tissue: Secondary | ICD-10-CM

## 2015-10-06 ENCOUNTER — Ambulatory Visit (HOSPITAL_COMMUNITY)
Admission: RE | Admit: 2015-10-06 | Discharge: 2015-10-06 | Disposition: A | Discharge status: Home / Self Care | Payer: Medicare Other | Source: Ambulatory Visit | Attending: Pulmonary Disease | Admitting: Pulmonary Disease

## 2015-10-06 DIAGNOSIS — M7981 Nontraumatic hematoma of soft tissue: Secondary | ICD-10-CM | POA: Insufficient documentation

## 2015-11-24 ENCOUNTER — Ambulatory Visit (HOSPITAL_COMMUNITY)
Admission: RE | Admit: 2015-11-24 | Discharge: 2015-11-24 | Disposition: A | Discharge status: Home / Self Care | Payer: Medicare Other | Source: Ambulatory Visit | Attending: Pulmonary Disease | Admitting: Pulmonary Disease

## 2015-11-24 ENCOUNTER — Other Ambulatory Visit (HOSPITAL_COMMUNITY): Payer: Self-pay | Admitting: Pulmonary Disease

## 2015-11-24 DIAGNOSIS — R05 Cough: Secondary | ICD-10-CM | POA: Insufficient documentation

## 2015-11-24 DIAGNOSIS — R059 Cough, unspecified: Secondary | ICD-10-CM

## 2016-01-01 ENCOUNTER — Other Ambulatory Visit: Payer: Self-pay | Admitting: Pulmonary Disease

## 2016-01-01 DIAGNOSIS — M7981 Nontraumatic hematoma of soft tissue: Secondary | ICD-10-CM

## 2016-01-09 ENCOUNTER — Ambulatory Visit
Admission: RE | Admit: 2016-01-09 | Discharge: 2016-01-09 | Disposition: A | Discharge status: Home / Self Care | Payer: Medicare Other | Source: Ambulatory Visit | Attending: Pulmonary Disease | Admitting: Pulmonary Disease

## 2016-01-09 DIAGNOSIS — M7981 Nontraumatic hematoma of soft tissue: Secondary | ICD-10-CM

## 2016-01-09 MED ORDER — IOPAMIDOL (ISOVUE-300) INJECTION 61%
100.0000 mL | Freq: Once | INTRAVENOUS | Status: AC | PRN
  Administered 2016-01-09: 100 mL via INTRAVENOUS

## 2016-07-23 ENCOUNTER — Other Ambulatory Visit (HOSPITAL_COMMUNITY): Payer: Self-pay | Admitting: Pulmonary Disease

## 2016-07-23 DIAGNOSIS — M545 Low back pain: Secondary | ICD-10-CM

## 2016-07-27 ENCOUNTER — Other Ambulatory Visit (HOSPITAL_COMMUNITY): Payer: Self-pay | Admitting: Pulmonary Disease

## 2016-07-27 DIAGNOSIS — S0990XA Unspecified injury of head, initial encounter: Secondary | ICD-10-CM

## 2016-07-27 DIAGNOSIS — R519 Headache, unspecified: Secondary | ICD-10-CM

## 2016-07-27 DIAGNOSIS — R51 Headache: Principal | ICD-10-CM

## 2016-07-28 ENCOUNTER — Ambulatory Visit (HOSPITAL_COMMUNITY): Payer: Medicare Other

## 2016-07-28 ENCOUNTER — Ambulatory Visit (HOSPITAL_COMMUNITY): Admission: RE | Admit: 2016-07-28 | Payer: Medicare Other | Source: Ambulatory Visit

## 2016-07-29 ENCOUNTER — Encounter (HOSPITAL_COMMUNITY): Payer: Self-pay

## 2016-07-29 ENCOUNTER — Ambulatory Visit (HOSPITAL_COMMUNITY): Admission: RE | Admit: 2016-07-29 | Payer: Medicare Other | Source: Ambulatory Visit

## 2016-07-29 ENCOUNTER — Ambulatory Visit (HOSPITAL_COMMUNITY): Payer: Medicare Other

## 2016-08-03 ENCOUNTER — Ambulatory Visit (HOSPITAL_COMMUNITY): Payer: Medicare Other

## 2016-08-03 ENCOUNTER — Ambulatory Visit (HOSPITAL_COMMUNITY): Admission: RE | Admit: 2016-08-03 | Payer: Medicare Other | Source: Ambulatory Visit

## 2016-08-03 ENCOUNTER — Ambulatory Visit (HOSPITAL_COMMUNITY)
Admission: RE | Admit: 2016-08-03 | Discharge: 2016-08-03 | Disposition: A | Discharge status: Home / Self Care | Payer: Medicare Other | Source: Ambulatory Visit | Attending: Pulmonary Disease | Admitting: Pulmonary Disease

## 2016-08-03 DIAGNOSIS — R519 Headache, unspecified: Secondary | ICD-10-CM

## 2016-08-03 DIAGNOSIS — S0990XA Unspecified injury of head, initial encounter: Secondary | ICD-10-CM

## 2016-08-03 DIAGNOSIS — M4802 Spinal stenosis, cervical region: Secondary | ICD-10-CM | POA: Diagnosis not present

## 2016-08-03 DIAGNOSIS — G8929 Other chronic pain: Secondary | ICD-10-CM

## 2016-08-03 DIAGNOSIS — R51 Headache: Secondary | ICD-10-CM | POA: Diagnosis present

## 2016-08-03 DIAGNOSIS — X58XXXA Exposure to other specified factors, initial encounter: Secondary | ICD-10-CM | POA: Diagnosis not present

## 2016-08-03 DIAGNOSIS — Z9889 Other specified postprocedural states: Secondary | ICD-10-CM | POA: Diagnosis not present

## 2016-08-03 DIAGNOSIS — M545 Low back pain: Secondary | ICD-10-CM

## 2017-01-24 ENCOUNTER — Other Ambulatory Visit: Payer: Self-pay | Admitting: Pulmonary Disease

## 2017-01-24 DIAGNOSIS — M542 Cervicalgia: Secondary | ICD-10-CM

## 2017-01-29 IMAGING — CT CT HEAD W/O CM
1 of 2 series · 13 of 30 positions shown, 17 images · non-contrast
Comparison: None.

CLINICAL DATA: 69-year-old with multiple recent falls at home,
presenting with headache. Initial encounter.

EXAM:
CT HEAD WITHOUT CONTRAST
TECHNIQUE: Contiguous axial images were obtained from the base of the skull
through the vertex without intravenous contrast.

[Series 2: head 5.0 h30s · axial · 0.41mm/px · z∈[-136,-6]mm · 13 of 32 slices shown, 17 images]
[im 3/32  brain]
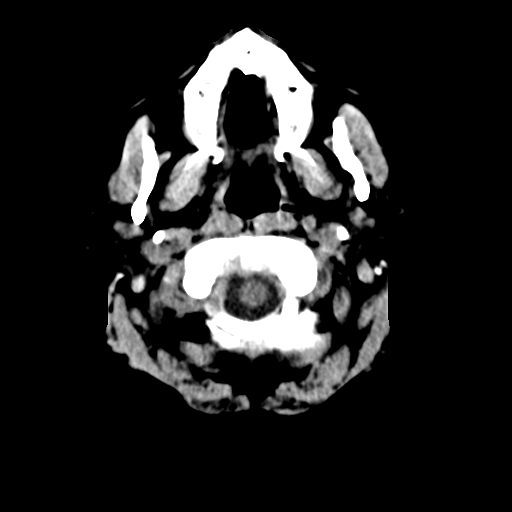
[im 3/32  bone]
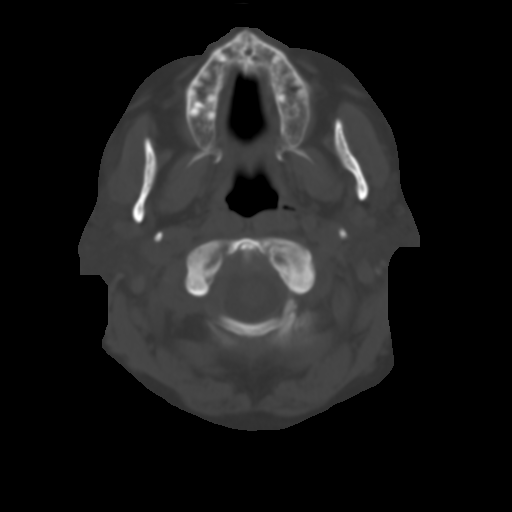
[im 5/32  brain]
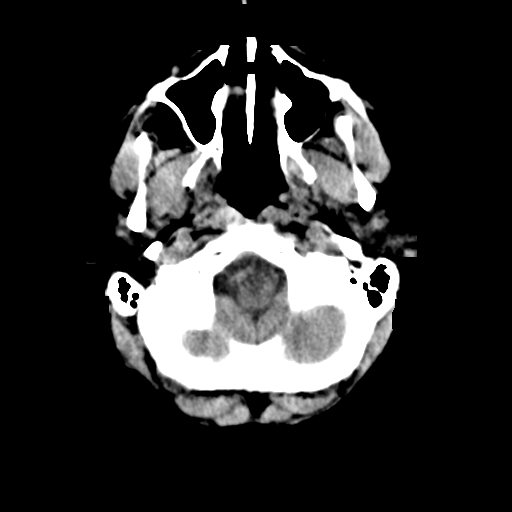
[im 7/32  brain]
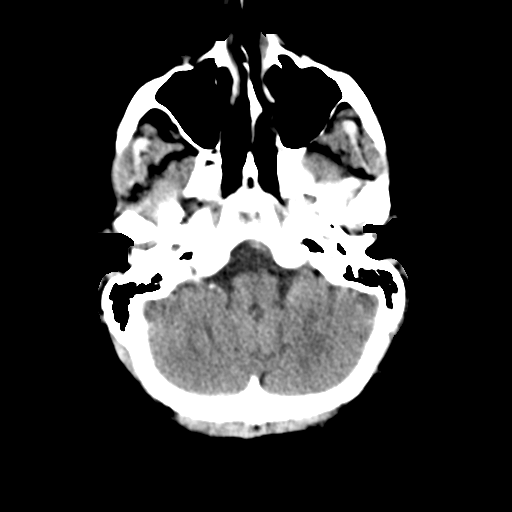
[im 9/32  brain]
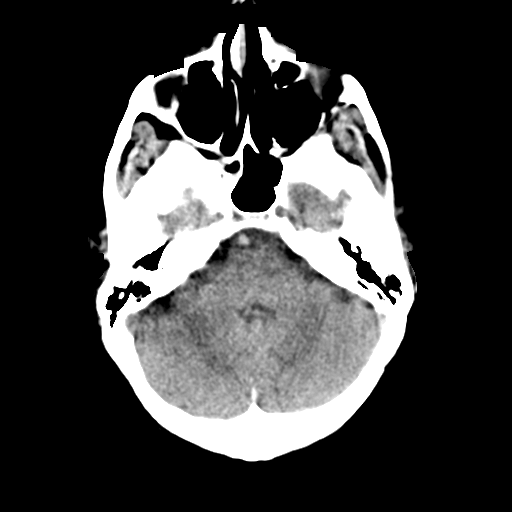
[im 12/32  brain]
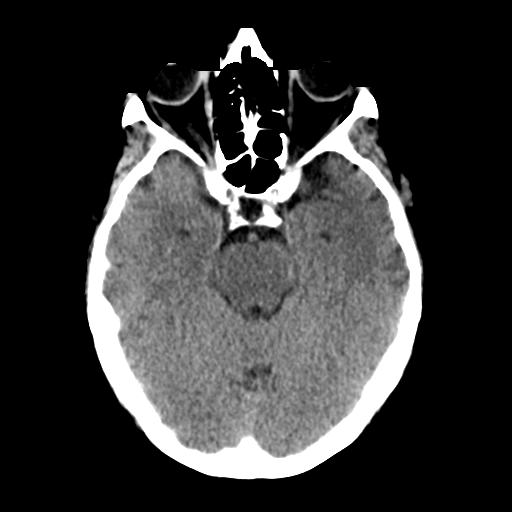
[im 12/32  bone]
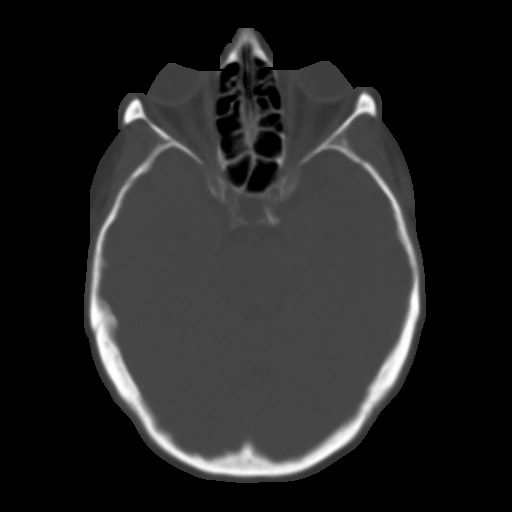
[im 14/32  brain]
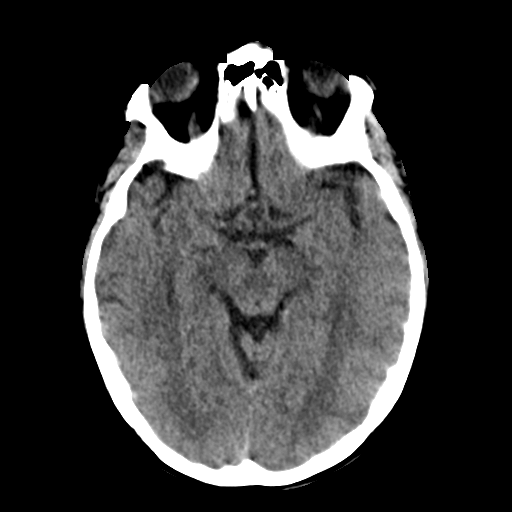
[im 16/32  brain]
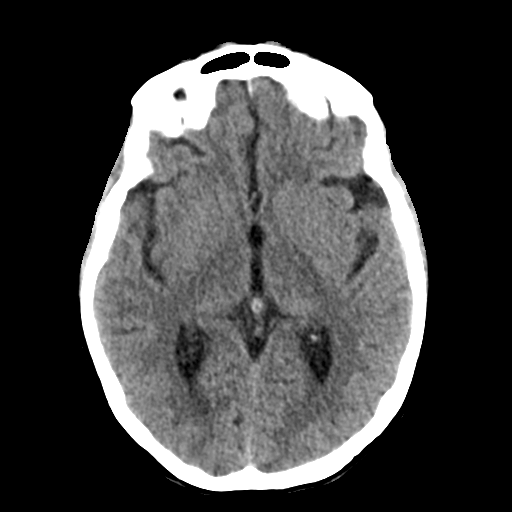
[im 18/32  brain]
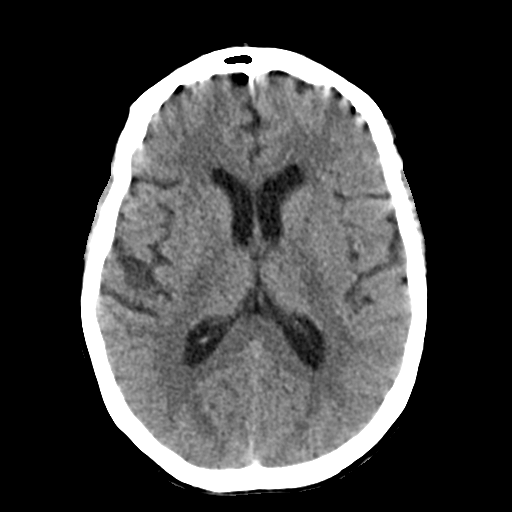
[im 20/32  brain]
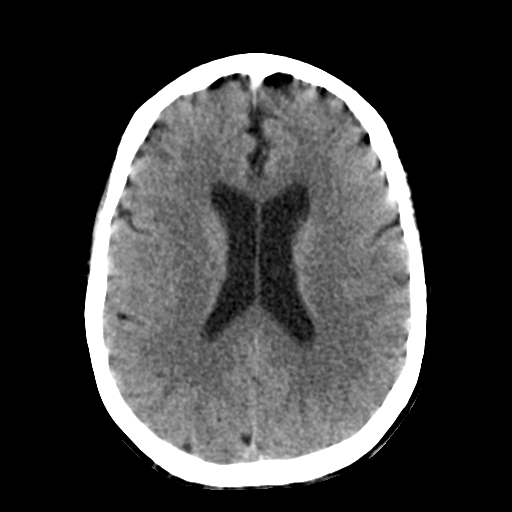
[im 20/32  bone]
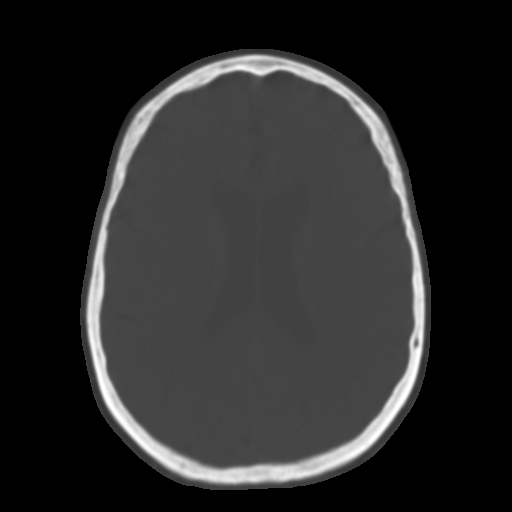
[im 23/32  brain]
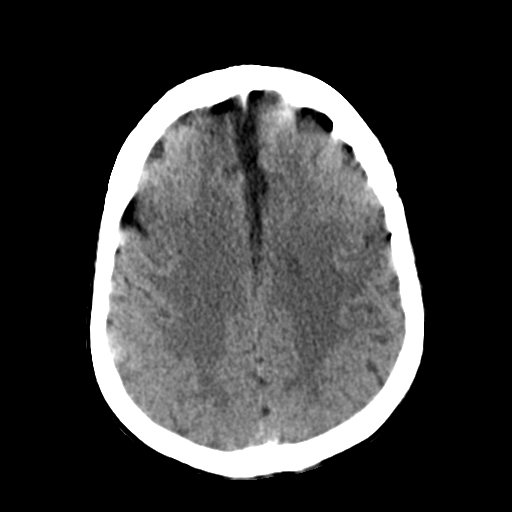
[im 25/32  brain]
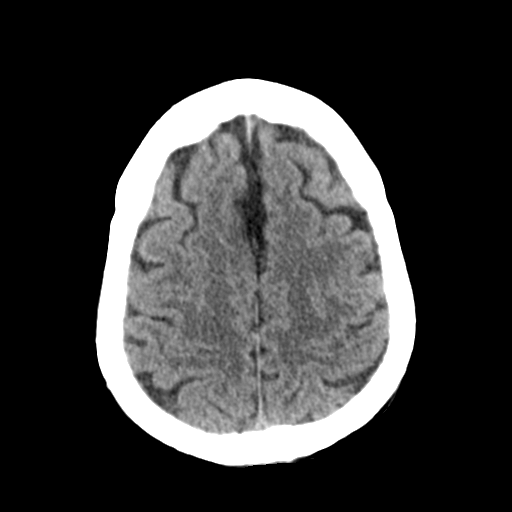
[im 27/32  brain]
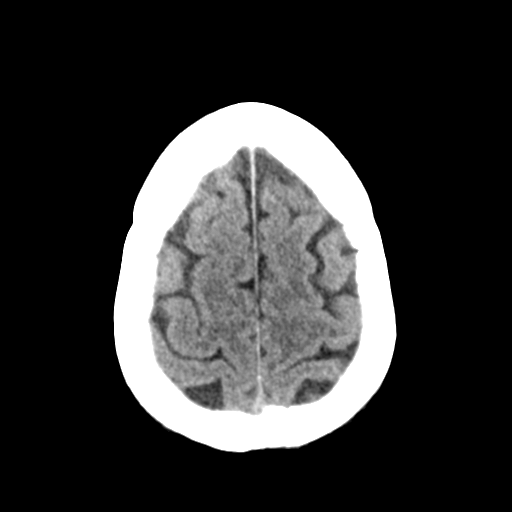
[im 29/32  brain]
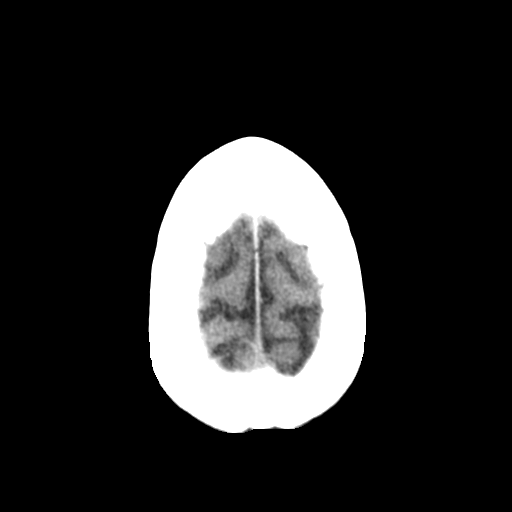
[im 29/32  bone]
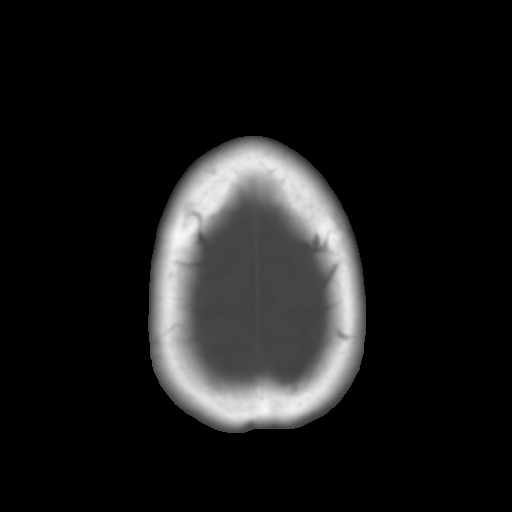

[13 of 30 positions shown; findings below may reference images not displayed]

FINDINGS: Motion degraded several of the images initially but these were
repeated and a diagnostic study was obtained.

No significant atrophy for patient age. Ventricular system normal in
size and appearance for age. Mild changes of small vessel disease of
the white matter diffusely, including the pons and brainstem. No
mass lesion. No midline shift. No acute hemorrhage or hematoma. No
extra-axial fluid collections. No evidence of acute infarction.

No skull fracture or other focal osseous abnormality involving the
skull. Visualized paranasal sinuses, bilateral mastoid air cells and
bilateral middle ear cavities well-aerated. Moderate bilateral
carotid siphon atherosclerosis.
IMPRESSION: 1. No acute intracranial abnormality.
2. Mild chronic microvascular ischemic changes of the white matter,
including the pons and brainstem.

## 2017-02-02 ENCOUNTER — Other Ambulatory Visit: Payer: Medicare Other

## 2017-03-24 ENCOUNTER — Emergency Department (HOSPITAL_COMMUNITY)
Admission: EM | Admit: 2017-03-24 | Discharge: 2017-03-24 | Disposition: A | Discharge status: Home / Self Care | Payer: Medicare Other | Attending: Emergency Medicine | Admitting: Emergency Medicine

## 2017-03-24 ENCOUNTER — Encounter (HOSPITAL_COMMUNITY): Payer: Self-pay | Admitting: *Deleted

## 2017-03-24 DIAGNOSIS — Z791 Long term (current) use of non-steroidal anti-inflammatories (NSAID): Secondary | ICD-10-CM | POA: Insufficient documentation

## 2017-03-24 DIAGNOSIS — J449 Chronic obstructive pulmonary disease, unspecified: Secondary | ICD-10-CM | POA: Insufficient documentation

## 2017-03-24 DIAGNOSIS — H00014 Hordeolum externum left upper eyelid: Secondary | ICD-10-CM | POA: Diagnosis not present

## 2017-03-24 DIAGNOSIS — I10 Essential (primary) hypertension: Secondary | ICD-10-CM | POA: Insufficient documentation

## 2017-03-24 DIAGNOSIS — Z91040 Latex allergy status: Secondary | ICD-10-CM | POA: Diagnosis not present

## 2017-03-24 DIAGNOSIS — R2243 Localized swelling, mass and lump, lower limb, bilateral: Secondary | ICD-10-CM | POA: Diagnosis present

## 2017-03-24 DIAGNOSIS — L03119 Cellulitis of unspecified part of limb: Secondary | ICD-10-CM | POA: Diagnosis not present

## 2017-03-24 DIAGNOSIS — Z7982 Long term (current) use of aspirin: Secondary | ICD-10-CM | POA: Insufficient documentation

## 2017-03-24 DIAGNOSIS — Z87891 Personal history of nicotine dependence: Secondary | ICD-10-CM | POA: Insufficient documentation

## 2017-03-24 DIAGNOSIS — Z79899 Other long term (current) drug therapy: Secondary | ICD-10-CM | POA: Diagnosis not present

## 2017-03-24 NOTE — Discharge Instructions (Signed)
Continue oral antibiotic and antibiotic eyedrop. Elevate legs.

## 2017-03-24 NOTE — ED Triage Notes (Signed)
Swelling both legs, recently treated for cellulitis

## 2017-03-25 ENCOUNTER — Other Ambulatory Visit (HOSPITAL_COMMUNITY)
Admission: RE | Admit: 2017-03-25 | Discharge: 2017-03-25 | Disposition: A | Discharge status: Home / Self Care | Payer: Medicare Other | Source: Ambulatory Visit | Attending: Pulmonary Disease | Admitting: Pulmonary Disease

## 2017-03-25 DIAGNOSIS — J449 Chronic obstructive pulmonary disease, unspecified: Secondary | ICD-10-CM | POA: Insufficient documentation

## 2017-03-25 DIAGNOSIS — G8929 Other chronic pain: Secondary | ICD-10-CM | POA: Diagnosis present

## 2017-03-25 DIAGNOSIS — L03115 Cellulitis of right lower limb: Secondary | ICD-10-CM | POA: Insufficient documentation

## 2017-03-25 DIAGNOSIS — I10 Essential (primary) hypertension: Secondary | ICD-10-CM | POA: Insufficient documentation

## 2017-03-25 LAB — CBC
HEMATOCRIT: 33.6 % — AB (ref 36.0–46.0)
Hemoglobin: 10.7 g/dL — ABNORMAL LOW (ref 12.0–15.0)
MCH: 26.7 pg (ref 26.0–34.0)
MCHC: 31.8 g/dL (ref 30.0–36.0)
MCV: 83.8 fL (ref 78.0–100.0)
PLATELETS: 304 10*3/uL (ref 150–400)
RBC: 4.01 MIL/uL (ref 3.87–5.11)
RDW: 14.2 % (ref 11.5–15.5)
WBC: 7.3 10*3/uL (ref 4.0–10.5)

## 2017-03-25 LAB — COMPREHENSIVE METABOLIC PANEL
ALBUMIN: 3.9 g/dL (ref 3.5–5.0)
ALK PHOS: 87 U/L (ref 38–126)
ALT: 15 U/L (ref 14–54)
ANION GAP: 10 (ref 5–15)
AST: 21 U/L (ref 15–41)
BUN: 23 mg/dL — ABNORMAL HIGH (ref 6–20)
CALCIUM: 8.9 mg/dL (ref 8.9–10.3)
CO2: 29 mmol/L (ref 22–32)
CREATININE: 0.72 mg/dL (ref 0.44–1.00)
Chloride: 92 mmol/L — ABNORMAL LOW (ref 101–111)
GFR calc Af Amer: >60 mL/min (ref >60)
GFR calc non Af Amer: >60 mL/min (ref >60)
Glucose, Bld: 114 mg/dL — ABNORMAL HIGH (ref 65–99)
Potassium: 4 mmol/L (ref 3.5–5.1)
SODIUM: 131 mmol/L — AB (ref 135–145)
TOTAL PROTEIN: 7.2 g/dL (ref 6.5–8.1)
Total Bilirubin: 0.2 mg/dL — ABNORMAL LOW (ref 0.3–1.2)

## 2017-03-26 NOTE — ED Provider Notes (Signed)
[facility]PT Provider Note   CSN: [id number] Arrival date & time: 03/24/17  1829     History   Chief Complaint Chief Complaint  Patient presents with  . Leg Swelling    HPI Kristina Horton is a 71 y.o. female.  Patient is concerned about redness and swelling in her lower legs for several days. Her primary care doctor suggested she come to the emergency department for further evaluation. No posterior thigh or calf tenderness. No fever, sweats, chills, dyspnea, chest pain. She is presently on clindamycin. She was seen by an ophthalmologist yesterday for left eye stye. Severity of symptoms mild to moderate.      Past Medical History:  Diagnosis Date  . Allergic rhinitis   . COPD (chronic obstructive pulmonary disease) (HCC)   . Depression   . Difficult intubation   . DJD (degenerative joint disease)   . Essential hypertension, benign   . Fibromyalgia   . Gastroparesis    2014  . IBS (irritable bowel syndrome)   . IC (interstitial cystitis)   . Iron deficiency anemia 05/25/2012   Requiring IV Feraheme   . Osteoarthritis   . PONV (postoperative nausea and vomiting)     Patient Active Problem List   Diagnosis Date Noted  . CAP (community acquired pneumonia) 11/16/2014  . OSA (obstructive sleep apnea) 11/07/2014  . Acute respiratory failure with hypoxia (HCC) 10/10/2014  . COPD exacerbation (HCC) 10/09/2014  . Gastroparesis 07/03/2013  . Preoperative cardiovascular examination 10/24/2012  . Iron deficiency anemia 05/25/2012  . Chronic cough 04/14/2011  . Fibromyalgia 04/14/2011  . IBS (irritable bowel syndrome) 04/14/2011  . IC (interstitial cystitis) 04/14/2011  . Essential hypertension, benign 04/14/2011  . Allergic rhinitis 04/14/2011  . Osteoarthritis 04/14/2011    Past Surgical History:  Procedure Laterality Date  . ANTERIOR CERVICAL DECOMP/DISCECTOMY FUSION    . APPENDECTOMY  1974  . BREAST SURGERY  1962   benign rumor  . COLONOSCOPY WITH  PROPOFOL  05/29/2012   Procedure: COLONOSCOPY WITH PROPOFOL;  Surgeon: Petra Kuba, MD;  Location: [facility];  Service: Endoscopy;  Laterality: N/A;  needs general  . ESOPHAGOGASTRODUODENOSCOPY (EGD) WITH PROPOFOL  05/29/2012   Procedure: ESOPHAGOGASTRODUODENOSCOPY (EGD) WITH PROPOFOL;  Surgeon: Petra Kuba, MD;  Location: [facility];  Service: Endoscopy;  Laterality: N/A;  . LUMBAR FUSION     2011  . SPINAL FUSION  2010   C2-T2, done in [place]  . THYROIDECTOMY, PARTIAL    . VESICO-VAGINAL FISTULA REPAIR  1997  . VESICOVAGINAL FISTULA CLOSURE W/ TAH  1998    OB History    No data available       Home Medications    Prior to Admission medications   Medication Sig Start Date End Date Taking? Authorizing Provider  ALPRAZolam (XANAX) 0.25 MG tablet Take 0.25 mg by mouth See admin instructions. Take 1 tablet (0.25 mg) by mouth daily at bedtime, may also take 1 tablet (0.25 mg) twice daily as needed for anxiety   Yes [provider]  amLODipine (NORVASC) 5 MG tablet Take 5 mg by mouth daily after breakfast.  10/02/14  Yes [provider]  aspirin EC 81 MG tablet Take 81 mg by mouth at bedtime.   Yes [provider]  bisacodyl (BISACODYL) 5 MG EC tablet Take 5-10 mg by mouth at bedtime. For stool softener   Yes [provider]  buPROPion (WELLBUTRIN XL) 300 MG 24 hr tablet Take 300 mg by mouth daily after supper.  Yes [provider]  Cholecalciferol (VITAMIN D-3) 1000 UNITS CAPS Take 2,000 Units by mouth at bedtime.    Yes [provider]  clidinium-chlordiazePOXIDE (LIBRAX) 5-2.5 MG per capsule Take 1 capsule by mouth 2 (two) times daily as needed (irritable bowel symptoms).    Yes [provider]  clindamycin (CLEOCIN) 150 MG capsule Take 150 mg by mouth 3 (three) times daily. 10 day course starting on 03/18/2017   Yes [provider]  cloNIDine (CATAPRES) 0.1 MG tablet Take 0.1 mg by mouth every morning.    Yes [provider]  DULoxetine (CYMBALTA) 60 MG capsule Take 120 mg by mouth at bedtime. Take with a 30 mg capsule for a 60 mg dose 05/22/07  Yes [provider]  EPINEPHrine (EPIPEN 2-PAK) 0.3 mg/0.3 mL DEVI Inject 0.3 mg into the muscle daily as needed (severe food allergies (usually occur a couple hours after ingestion)).    Yes [provider]  fentaNYL (DURAGESIC - DOSED MCG/HR) 50 MCG/HR Place 50 mcg onto the skin every 3 (three) days.  09/18/14  Yes [provider]  levalbuterol (XOPENEX HFA) 45 MCG/ACT inhaler Inhale 2 puffs into the lungs every 6 (six) hours as needed for wheezing or shortness of breath.    Yes [provider]  levocetirizine (XYZAL) 5 MG tablet Take 5 mg by mouth every morning.   Yes [provider]  levothyroxine (SYNTHROID, LEVOTHROID) 75 MCG tablet Take 75 mcg by mouth daily before breakfast.    Yes [provider]  Magnesium Hydroxide (PHILLIPS MILK OF MAGNESIA PO) Take 500-1,000 mg by mouth at bedtime.   Yes [provider]  meloxicam (MOBIC) 15 MG tablet Take 15 mg by mouth daily after supper.  09/27/14  Yes [provider]  methylphenidate (RITALIN) 20 MG tablet Take 20-40 mg by mouth See admin instructions. Take 1 tablet (20 mg) by mouth before breakfast and 2 tablets (40 mg) after breakfast (approx 10am)   Yes [provider]  Multiple Vitamins-Minerals (PRESERVISION AREDS 2) CAPS Take 1 capsule by mouth at bedtime.   Yes [provider]  ondansetron (ZOFRAN-ODT) 8 MG disintegrating tablet Take 8 mg by mouth every 6 (six) hours as needed for nausea or vomiting.    Yes [provider]  oxycodone (OXY-IR) 5 MG capsule Take 10 mg by mouth every 4 (four) hours as needed (breakthrough pain).    Yes [provider]  pentosan polysulfate (ELMIRON) 100 MG capsule Take 200 mg by mouth 2 (two) times daily. Before breakfast and at bedtime   Yes [provider]  potassium chloride (KLOR-CON) 10 MEQ CR tablet Take 20 mEq by mouth 2 (two) times daily after a meal. After breakfast and supper   Yes [provider]  PRESCRIPTION MEDICATION Apply 1 application topically 2 (two) times daily as needed (chafing between legs). Fluticasone prop 0.05% cream + ketoconazole 2% cream compounded at pharmacy   Yes [provider]  Probiotic Product (PROBIOTIC PO) Take 1 capsule by mouth at bedtime.   Yes [provider]  RABEprazole (ACIPHEX) 20 MG tablet Take 40 mg by mouth 2 (two) times daily. Before breakfast and at bedtime   Yes [provider]  sodium chloride (OCEAN) 0.65 % nasal spray Place 1 spray into the nose daily as needed for congestion.    Yes [provider]  talc (ZEASORB) powder Apply 1 application topically 2 (two) times daily as needed (chafing between legs).   Yes [provider]  telmisartan (MICARDIS) 80 MG tablet Take 1 tablet by mouth daily.   Yes [provider]  triamcinolone (NASACORT) 55 MCG/ACT nasal inhaler Place 2 sprays into the nose 2 (two) times daily as needed (congestion).  05/12/12  Yes Leslye Peer, MD  triamterene-hydrochlorothiazide (MAXZIDE) 75-50 MG per tablet Take 1 tablet by mouth daily after breakfast.    Yes [provider]  Respiratory Therapy Supplies (FLUTTER) DEVI Use as directed 11/15/14   Nyoka Cowden, MD    Family History Family History  Problem Relation Age of Onset  . COPD Mother   . Heart failure Mother   . Alcohol abuse Mother   . Diabetes Mother   . Heart failure Father   . Alcohol abuse Brother     Social History Social History  Substance Use Topics  . Smoking status: Former Smoker    Packs/day: 3.00    Years: 18.00    Types: Cigarettes    Quit date: 07/12/1978  . Smokeless tobacco: Never Used  . Alcohol use No     Allergies   Augmentin [amoxicillin-pot clavulanate]; Iodine; Shellfish-derived products; Fluconazole;  Latex; Meperidine; Other; Selenium; Sulfa antibiotics; Tape; Clindamycin/lincomycin; Demerol; Fish allergy; Hydralazine; Levaquin [levofloxacin]; Potassium-containing compounds; Sulfa drugs cross reactors; Valium [diazepam]; Zithromax [azithromycin]; Doxycycline; Tizanidine; and Xylitol   Review of Systems Review of Systems  All other systems reviewed and are negative.    Physical Exam Updated Vital Signs BP (!) 148/83 (BP Location: Right Arm)   Pulse 85   Temp 98.4 F (36.9 C) (Oral)   Resp 16   Ht  (1.448 m)   Wt 86.2 kg (190 lb)   SpO2 98%   BMI 41.12 kg/m   Physical Exam  Constitutional: She is oriented to person, place, and time. She appears well-developed and well-nourished.  HENT:  Head: Normocephalic and atraumatic.  Eyes:  Stye on left upper lid  Neck: Neck supple.  Cardiovascular: Normal rate and regular rhythm.   Pulmonary/Chest: Effort normal and breath sounds normal.  Abdominal: Soft. Bowel sounds are normal.  Musculoskeletal: Normal range of motion.  Neurological: She is alert and oriented to person, place, and time.  Skin:  Mild erythema on bilateral lower extremities. No calf or posterior thigh tenderness to suggest DVT.  Psychiatric: She has a normal mood and affect. Her behavior is normal.  Nursing note and vitals reviewed.    ED Treatments / Results  Labs (all labs ordered are listed, but only abnormal results are displayed) Labs Reviewed - No data to display  EKG  EKG Interpretation None       Radiology No results found.  Procedures Procedures (including critical care time)  Medications Ordered in ED Medications - No data to display   Initial Impression / Assessment and Plan / ED Course  I have reviewed the triage vital signs and the nursing notes.  Pertinent labs & imaging results that were available during my care of the patient were reviewed by me and considered in my medical decision making (see chart for details).      Patient is stable. Will continue oral antibiotics and antibiotic eyedrops. She has primary care follow-up.  Final Clinical Impressions(s) / ED Diagnoses   Final diagnoses:  Cellulitis of lower extremity, unspecified laterality  Hordeolum externum of left upper eyelid    New Prescriptions Discharge Medication List as of 03/24/2017  8:25 PM       Donnetta Hutching, MD 03/26/17 1201

## 2017-03-29 ENCOUNTER — Emergency Department (HOSPITAL_COMMUNITY)
Admission: EM | Admit: 2017-03-29 | Discharge: 2017-03-29 | Disposition: A | Discharge status: Home / Self Care | Payer: Medicare Other | Attending: Emergency Medicine | Admitting: Emergency Medicine

## 2017-03-29 ENCOUNTER — Emergency Department (HOSPITAL_COMMUNITY): Payer: Medicare Other

## 2017-03-29 ENCOUNTER — Encounter (HOSPITAL_COMMUNITY): Payer: Self-pay | Admitting: Emergency Medicine

## 2017-03-29 DIAGNOSIS — Z91040 Latex allergy status: Secondary | ICD-10-CM | POA: Diagnosis not present

## 2017-03-29 DIAGNOSIS — Z87891 Personal history of nicotine dependence: Secondary | ICD-10-CM | POA: Diagnosis not present

## 2017-03-29 DIAGNOSIS — S8002XA Contusion of left knee, initial encounter: Secondary | ICD-10-CM

## 2017-03-29 DIAGNOSIS — Z791 Long term (current) use of non-steroidal anti-inflammatories (NSAID): Secondary | ICD-10-CM | POA: Diagnosis not present

## 2017-03-29 DIAGNOSIS — S0990XA Unspecified injury of head, initial encounter: Secondary | ICD-10-CM | POA: Diagnosis not present

## 2017-03-29 DIAGNOSIS — Z7982 Long term (current) use of aspirin: Secondary | ICD-10-CM | POA: Insufficient documentation

## 2017-03-29 DIAGNOSIS — Y929 Unspecified place or not applicable: Secondary | ICD-10-CM | POA: Diagnosis not present

## 2017-03-29 DIAGNOSIS — Z79899 Other long term (current) drug therapy: Secondary | ICD-10-CM | POA: Insufficient documentation

## 2017-03-29 DIAGNOSIS — Y999 Unspecified external cause status: Secondary | ICD-10-CM | POA: Insufficient documentation

## 2017-03-29 DIAGNOSIS — I10 Essential (primary) hypertension: Secondary | ICD-10-CM | POA: Insufficient documentation

## 2017-03-29 DIAGNOSIS — W010XXA Fall on same level from slipping, tripping and stumbling without subsequent striking against object, initial encounter: Secondary | ICD-10-CM | POA: Insufficient documentation

## 2017-03-29 DIAGNOSIS — S8992XA Unspecified injury of left lower leg, initial encounter: Secondary | ICD-10-CM | POA: Diagnosis present

## 2017-03-29 DIAGNOSIS — Y939 Activity, unspecified: Secondary | ICD-10-CM | POA: Insufficient documentation

## 2017-03-29 DIAGNOSIS — J449 Chronic obstructive pulmonary disease, unspecified: Secondary | ICD-10-CM | POA: Diagnosis not present

## 2017-03-29 MED ORDER — OXYCODONE HCL 5 MG PO TABS
5.0000 mg | ORAL_TABLET | Freq: Once | ORAL | Status: AC
  Administered 2017-03-29: 5 mg via ORAL
  Filled 2017-03-29: qty 1

## 2017-03-29 NOTE — Discharge Instructions (Signed)
Wear the immobilizer as needed. Apply ice several times a day. Take your oxycodone as needed for pain control.

## 2017-03-29 NOTE — ED Triage Notes (Signed)
Pt s/p fall at home. C/O pain and swelling to L. Knee. Also hit front of head on floor when she fell.

## 2017-03-29 NOTE — ED Notes (Signed)
Pt has bruise to L. Forehead as well as bruising and edema to L. Knee.

## 2017-03-29 NOTE — ED Provider Notes (Signed)
[facility]PT Provider Note   CSN: [id number] Arrival date & time: 03/29/17  0139     History   Chief Complaint Chief Complaint  Patient presents with  . Fall    HPI Kristina Horton is a 71 y.o. female.  The history is provided by the patient.  She slipped and fell landing on her left knee. She also landed on her left hand and hit her head. There is no loss of consciousness. She was unable to bear weight following the fall. Pain is rated at 10/10. She has not taken anything for pain. Of note, she does have chronic pain for which she is on a fentanyl patch and takes oxycodone for breakthrough pain.  She is on low-dose aspirin, but no other anticoagulants or antiplatelet agents.  Past Medical History:  Diagnosis Date  . Allergic rhinitis   . COPD (chronic obstructive pulmonary disease) (HCC)   . Depression   . Difficult intubation   . DJD (degenerative joint disease)   . Essential hypertension, benign   . Fibromyalgia   . Gastroparesis    2014  . IBS (irritable bowel syndrome)   . IC (interstitial cystitis)   . Iron deficiency anemia 05/25/2012   Requiring IV Feraheme   . Osteoarthritis   . PONV (postoperative nausea and vomiting)     Patient Active Problem List   Diagnosis Date Noted  . CAP (community acquired pneumonia) 11/16/2014  . OSA (obstructive sleep apnea) 11/07/2014  . Acute respiratory failure with hypoxia (HCC) 10/10/2014  . COPD exacerbation (HCC) 10/09/2014  . Gastroparesis 07/03/2013  . Preoperative cardiovascular examination 10/24/2012  . Iron deficiency anemia 05/25/2012  . Chronic cough 04/14/2011  . Fibromyalgia 04/14/2011  . IBS (irritable bowel syndrome) 04/14/2011  . IC (interstitial cystitis) 04/14/2011  . Essential hypertension, benign 04/14/2011  . Allergic rhinitis 04/14/2011  . Osteoarthritis 04/14/2011    Past Surgical History:  Procedure Laterality Date  . ANTERIOR CERVICAL DECOMP/DISCECTOMY FUSION    . APPENDECTOMY  1974    . BREAST SURGERY  1962   benign rumor  . COLONOSCOPY WITH PROPOFOL  05/29/2012   Procedure: COLONOSCOPY WITH PROPOFOL;  Surgeon: Petra Kuba, MD;  Location: [facility];  Service: Endoscopy;  Laterality: N/A;  needs general  . ESOPHAGOGASTRODUODENOSCOPY (EGD) WITH PROPOFOL  05/29/2012   Procedure: ESOPHAGOGASTRODUODENOSCOPY (EGD) WITH PROPOFOL;  Surgeon: Petra Kuba, MD;  Location: [facility];  Service: Endoscopy;  Laterality: N/A;  . LUMBAR FUSION     2011  . SPINAL FUSION  2010   C2-T2, done in [place]  . THYROIDECTOMY, PARTIAL    . VESICO-VAGINAL FISTULA REPAIR  1997  . VESICOVAGINAL FISTULA CLOSURE W/ TAH  1998    OB History    No data available       Home Medications    Prior to Admission medications   Medication Sig Start Date End Date Taking? Authorizing Provider  ALPRAZolam (XANAX) 0.25 MG tablet Take 0.25 mg by mouth See admin instructions. Take 1 tablet (0.25 mg) by mouth daily at bedtime, may also take 1 tablet (0.25 mg) twice daily as needed for anxiety    [provider]  amLODipine (NORVASC) 5 MG tablet Take 5 mg by mouth daily after breakfast.  10/02/14   [provider]  aspirin EC 81 MG tablet Take 81 mg by mouth at bedtime.    [provider]  bisacodyl (BISACODYL) 5 MG EC tablet Take 5-10 mg by mouth at bedtime. For stool softener  [provider]  buPROPion (WELLBUTRIN XL) 300 MG 24 hr tablet Take 300 mg by mouth daily after supper.     [provider]  Cholecalciferol (VITAMIN D-3) 1000 UNITS CAPS Take 2,000 Units by mouth at bedtime.     [provider]  clidinium-chlordiazePOXIDE (LIBRAX) 5-2.5 MG per capsule Take 1 capsule by mouth 2 (two) times daily as needed (irritable bowel symptoms).     [provider]  clindamycin (CLEOCIN) 150 MG capsule Take 150 mg by mouth 3 (three) times daily. 10 day course starting on 03/18/2017    [provider]  cloNIDine (CATAPRES) 0.1 MG tablet  Take 0.1 mg by mouth every morning.    [provider]  DULoxetine (CYMBALTA) 60 MG capsule Take 120 mg by mouth at bedtime. Take with a 30 mg capsule for a 60 mg dose 05/22/07   [provider]  EPINEPHrine (EPIPEN 2-PAK) 0.3 mg/0.3 mL DEVI Inject 0.3 mg into the muscle daily as needed (severe food allergies (usually occur a couple hours after ingestion)).     [provider]  fentaNYL (DURAGESIC - DOSED MCG/HR) 50 MCG/HR Place 50 mcg onto the skin every 3 (three) days.  09/18/14   [provider]  levalbuterol Pauline Aus HFA) 45 MCG/ACT inhaler Inhale 2 puffs into the lungs every 6 (six) hours as needed for wheezing or shortness of breath.     [provider]  levocetirizine (XYZAL) 5 MG tablet Take 5 mg by mouth every morning.    [provider]  levothyroxine (SYNTHROID, LEVOTHROID) 75 MCG tablet Take 75 mcg by mouth daily before breakfast.     [provider]  Magnesium Hydroxide (PHILLIPS MILK OF MAGNESIA PO) Take 500-1,000 mg by mouth at bedtime.    [provider]  meloxicam (MOBIC) 15 MG tablet Take 15 mg by mouth daily after supper.  09/27/14   [provider]  methylphenidate (RITALIN) 20 MG tablet Take 20-40 mg by mouth See admin instructions. Take 1 tablet (20 mg) by mouth before breakfast and 2 tablets (40 mg) after breakfast (approx 10am)    [provider]  Multiple Vitamins-Minerals (PRESERVISION AREDS 2) CAPS Take 1 capsule by mouth at bedtime.    [provider]  ondansetron (ZOFRAN-ODT) 8 MG disintegrating tablet Take 8 mg by mouth every 6 (six) hours as needed for nausea or vomiting.     [provider]  oxycodone (OXY-IR) 5 MG capsule Take 10 mg by mouth every 4 (four) hours as needed (breakthrough pain).     [provider]  pentosan polysulfate (ELMIRON) 100 MG capsule Take 200 mg by mouth 2 (two) times daily. Before breakfast and at bedtime    [provider]    potassium chloride (KLOR-CON) 10 MEQ CR tablet Take 20 mEq by mouth 2 (two) times daily after a meal. After breakfast and supper    [provider]  PRESCRIPTION MEDICATION Apply 1 application topically 2 (two) times daily as needed (chafing between legs). Fluticasone prop 0.05% cream + ketoconazole 2% cream compounded at pharmacy    [provider]  Probiotic Product (PROBIOTIC PO) Take 1 capsule by mouth at bedtime.    [provider]  RABEprazole (ACIPHEX) 20 MG tablet Take 40 mg by mouth 2 (two) times daily. Before breakfast and at bedtime    [provider]  Respiratory Therapy Supplies (FLUTTER) DEVI Use as directed 11/15/14   Nyoka Cowden, MD  sodium chloride (OCEAN) 0.65 % nasal spray  Place 1 spray into the nose daily as needed for congestion.     [provider]  talc (ZEASORB) powder Apply 1 application topically 2 (two) times daily as needed (chafing between legs).    [provider]  telmisartan (MICARDIS) 80 MG tablet Take 1 tablet by mouth daily.    [provider]  triamcinolone (NASACORT) 55 MCG/ACT nasal inhaler Place 2 sprays into the nose 2 (two) times daily as needed (congestion).  05/12/12   Leslye Peer, MD  triamterene-hydrochlorothiazide (MAXZIDE) 75-50 MG per tablet Take 1 tablet by mouth daily after breakfast.     [provider]    Family History Family History  Problem Relation Age of Onset  . COPD Mother   . Heart failure Mother   . Alcohol abuse Mother   . Diabetes Mother   . Heart failure Father   . Alcohol abuse Brother     Social History Social History  Substance Use Topics  . Smoking status: Former Smoker    Packs/day: 3.00    Years: 18.00    Types: Cigarettes    Quit date: 07/12/1978  . Smokeless tobacco: Never Used  . Alcohol use No     Allergies   Augmentin [amoxicillin-pot clavulanate]; Iodine; Shellfish-derived products; Fluconazole; Latex; Meperidine; Other;  Selenium; Sulfa antibiotics; Tape; Clindamycin/lincomycin; Demerol; Fish allergy; Hydralazine; Levaquin [levofloxacin]; Potassium-containing compounds; Sulfa drugs cross reactors; Valium [diazepam]; Zithromax [azithromycin]; Doxycycline; Tizanidine; and Xylitol   Review of Systems Review of Systems  All other systems reviewed and are negative.    Physical Exam Updated Vital Signs BP (!) 150/69   Pulse 71   Temp 98 F (36.7 C) (Oral)   Resp 18   SpO2 97%   Physical Exam  Nursing note and vitals reviewed.  71 year old female, resting comfortably and in no acute distress. Vital signs are significant for hypertension. Oxygen saturation is 97%, which is normal. Head is normocephalic and atraumatic. PERRLA, EOMI. Oropharynx is clear. Neck is mildly tender in the midline without adenopathy or JVD. There is no point tenderness. Back is nontender and there is no CVA tenderness. Lungs are clear without rales, wheezes, or rhonchi. Chest is nontender. Heart has regular rate and rhythm without murmur. Abdomen is soft, flat, nontender without masses or hepatosplenomegaly and peristalsis is normoactive. Extremities: Moderate to severe ecchymosis and soft tissue swelling over the anterior aspect of the left knee, extending both laterally and medially. There is no instability of the knee joint, but range of motion testing is restricted by pain. Minor ecchymosis in the left hand is noted without swelling and without tenderness. No other 70 injury is seen. Skin is warm and dry without rash. Neurologic: Mental status is normal, cranial nerves are intact, there are no motor or sensory deficits.  ED Treatments / Results  Labs (all labs ordered are listed, but only abnormal results are displayed) Labs Reviewed - No data to display  EKG  EKG Interpretation None       Radiology Ct Head Wo Contrast  Result Date: 03/29/2017 CLINICAL DATA:  Fall with head trauma EXAM: CT HEAD WITHOUT CONTRAST CT  CERVICAL SPINE WITHOUT CONTRAST TECHNIQUE: Multidetector CT imaging of the head and cervical spine was performed following the standard protocol without intravenous contrast. Multiplanar CT image reconstructions of the cervical spine were also generated. COMPARISON:  Brain MRI 08/03/2016 FINDINGS: CT HEAD FINDINGS Brain: No mass lesion, intraparenchymal hemorrhage or extra-axial collection. No evidence of acute cortical infarct. There is periventricular hypoattenuation  compatible with chronic microvascular disease. Vascular: No hyperdense vessel or unexpected calcification. Skull: Normal visualized skull base, calvarium and extracranial soft tissues. Sinuses/Orbits: No sinus fluid levels or advanced mucosal thickening. No mastoid effusion. Normal orbits. CT CERVICAL SPINE FINDINGS Alignment: There is asymmetry of the space surrounding the dens. The left C1 lateral mass is subluxed medially. However, given the degree of cystic change, this is favored to be chronic. There is also extensive chronic degenerative cystic change of the odontoid process. The remainder of the cervical spine is in normal alignment. Skull base and vertebrae: There is anterior fusion hardware extending from C4-C7 and posterior fusion hardware extending from C2-T2. No abnormal perihardware lucency. There is no acute fracture of the cervical spine. Soft tissues and spinal canal: No prevertebral fluid or swelling. No visible canal hematoma. Disc levels: No advanced spinal canal or neural foraminal stenosis. Upper chest: No pneumothorax, pulmonary nodule or pleural effusion. Other: Normal visualized paraspinal cervical soft tissues. IMPRESSION: 1. 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 2. No acute fracture of the cervical spine. 3. Severe degenerative change with extensive subarticular cystic change at the skullbase-C1-C2 articulations. 4. Anterior and posterior cervical fusion hardware without  focal abnormality. Electronically Signed   By: Deatra Robinson M.D.   On: 03/29/2017 06:08   Ct Cervical Spine Wo Contrast  Result Date: 03/29/2017 CLINICAL DATA:  Fall with head trauma EXAM: CT HEAD WITHOUT CONTRAST CT CERVICAL SPINE WITHOUT CONTRAST TECHNIQUE: Multidetector CT imaging of the head and cervical spine was performed following the standard protocol without intravenous contrast. Multiplanar CT image reconstructions of the cervical spine were also generated. COMPARISON:  Brain MRI 08/03/2016 FINDINGS: CT HEAD FINDINGS Brain: No mass lesion, intraparenchymal hemorrhage or extra-axial collection. No evidence of acute cortical infarct. There is periventricular hypoattenuation compatible with chronic microvascular disease. Vascular: No hyperdense vessel or unexpected calcification. Skull: Normal visualized skull base, calvarium and extracranial soft tissues. Sinuses/Orbits: No sinus fluid levels or advanced mucosal thickening. No mastoid effusion. Normal orbits. CT CERVICAL SPINE FINDINGS Alignment: There is asymmetry of the space surrounding the dens. The left C1 lateral mass is subluxed medially. However, given the degree of cystic change, this is favored to be chronic. There is also extensive chronic degenerative cystic change of the odontoid process. The remainder of the cervical spine is in normal alignment. Skull base and vertebrae: There is anterior fusion hardware extending from C4-C7 and posterior fusion hardware extending from C2-T2. No abnormal perihardware lucency. There is no acute fracture of the cervical spine. Soft tissues and spinal canal: No prevertebral fluid or swelling. No visible canal hematoma. Disc levels: No advanced spinal canal or neural foraminal stenosis. Upper chest: No pneumothorax, pulmonary nodule or pleural effusion. Other: Normal visualized paraspinal cervical soft tissues. IMPRESSION: 1. Sequelae of chronic hypertensive microangiopathy without acute intracranial  abnormality. 2. No acute fracture of the cervical spine. 3. Severe degenerative change with extensive subarticular cystic change at the skullbase-C1-C2 articulations. 4. Anterior and posterior cervical fusion hardware without focal abnormality. Electronically Signed   By: Deatra Robinson M.D.   On: 03/29/2017 06:08   Dg Knee Complete 4 Views Left  Result Date: 03/29/2017 CLINICAL DATA:  Larey Seat on the knee with pain and swelling EXAM: LEFT KNEE - COMPLETE 4+ VIEW COMPARISON:  None. FINDINGS: Moderate patellofemoral degenerative changes. No definite acute displaced fracture or malalignment. Mild degenerative changes of the medial and lateral joint space compartments. Joint space calcifications. Possible loose bodies projecting over the medial, posterior joint space and  the suprapatellar region. Large prepatellar soft tissue swelling. IMPRESSION: 1. Degenerative changes.  No definite acute osseous abnormality 2. Large prepatellar soft tissue swelling. 3. Possible small loose bodies posterior knee and suprapatellar knee. Electronically Signed   By: Jasmine Pang M.D.   On: 03/29/2017 03:17    Procedures Procedures (including critical care time)  Medications Ordered in ED Medications  oxyCODONE (Oxy IR/ROXICODONE) immediate release tablet 5 mg (not administered)     Initial Impression / Assessment and Plan / ED Course  I have reviewed the triage vital signs and the nursing notes.  Pertinent labs & imaging results that were available during my care of the patient were reviewed by me and considered in my medical decision making (see chart for details).  Fall with injury to left knee. X-rays show no fracture, but possible loose bodies. She is being sent for CT of head and cervical spine. Knee immobilizer is applied. Of note, patient does have a walker at home.  She feels much better with the knee immobilizer in place. CT scans do not show any acute injury. She is discharged with instructions to continue  using her fentanyl patch and oxycodone as needed for pain. Advised on ice and elevation. Follow-up with her orthopedic physician.  Final Clinical Impressions(s) / ED Diagnoses   Final diagnoses:  Fall from slipping, initial encounter  Contusion of left knee, initial encounter     New Prescriptions New Prescriptions   No medications on file     Dione Booze, MD 03/29/17 [phone]

## 2017-03-30 LAB — CULTURE, BLOOD (ROUTINE X 2)
CULTURE: NO GROWTH
Culture: NO GROWTH
SPECIAL REQUESTS: ADEQUATE
SPECIAL REQUESTS: ADEQUATE

## 2020-03-12 DEATH — deceased
